# Patient Record
Sex: Male | Born: 1935 | Race: White | Hispanic: No | Marital: Married | Smoking: Never smoker
Health system: Southern US, Community
[De-identification: ages and names within clinical notes are randomized; demographics above are authoritative.]

## PROBLEM LIST (undated history)

## (undated) VITALS — BP 128/72 | HR 60 | Temp 97.8°F | Resp 18 | Ht 70.98 in | Wt 245.7 lb

## (undated) DIAGNOSIS — N400 Enlarged prostate without lower urinary tract symptoms: Secondary | ICD-10-CM

## (undated) DIAGNOSIS — G2581 Restless legs syndrome: Secondary | ICD-10-CM

## (undated) DIAGNOSIS — I2510 Atherosclerotic heart disease of native coronary artery without angina pectoris: Secondary | ICD-10-CM

## (undated) DIAGNOSIS — I10 Essential (primary) hypertension: Secondary | ICD-10-CM

## (undated) DIAGNOSIS — I739 Peripheral vascular disease, unspecified: Secondary | ICD-10-CM

## (undated) DIAGNOSIS — E785 Hyperlipidemia, unspecified: Secondary | ICD-10-CM

## (undated) DIAGNOSIS — I6529 Occlusion and stenosis of unspecified carotid artery: Secondary | ICD-10-CM

## (undated) HISTORY — DX: Essential (primary) hypertension: I10

## (undated) HISTORY — DX: Occlusion and stenosis of unspecified carotid artery: I65.29

## (undated) HISTORY — PX: APPENDECTOMY: SHX54

## (undated) HISTORY — DX: Benign prostatic hyperplasia without lower urinary tract symptoms: N40.0

## (undated) HISTORY — DX: Hyperlipidemia, unspecified: E78.5

## (undated) HISTORY — DX: Restless legs syndrome: G25.81

## (undated) HISTORY — PX: CATARACT EXTRACTION: SUR2

## (undated) HISTORY — DX: Peripheral vascular disease, unspecified: I73.9

## (undated) HISTORY — DX: Atherosclerotic heart disease of native coronary artery without angina pectoris: I25.10

---

## 2003-03-21 HISTORY — PX: OTHER SURGICAL HISTORY: SHX169

## 2004-03-20 HISTORY — PX: OTHER SURGICAL HISTORY: SHX169

## 2004-05-20 ENCOUNTER — Ambulatory Visit: Payer: Self-pay | Admitting: Surgery

## 2004-06-06 ENCOUNTER — Inpatient Hospital Stay: Payer: Self-pay | Admitting: Surgery

## 2007-03-21 HISTORY — PX: CORONARY ARTERY BYPASS GRAFT: SHX141

## 2011-05-09 ENCOUNTER — Emergency Department: Payer: Self-pay | Admitting: *Deleted

## 2011-05-09 LAB — URINALYSIS, COMPLETE
Bacteria: NONE SEEN
Blood: NEGATIVE
Glucose,UR: NEGATIVE mg/dL (ref 0–75)
Leukocyte Esterase: NEGATIVE
Nitrite: NEGATIVE
Ph: 5 (ref 4.5–8.0)
RBC,UR: NONE SEEN /HPF (ref 0–5)
Squamous Epithelial: NONE SEEN
WBC UR: NONE SEEN /HPF (ref 0–5)

## 2011-12-04 ENCOUNTER — Inpatient Hospital Stay: Payer: Self-pay | Admitting: Surgery

## 2011-12-04 LAB — COMPREHENSIVE METABOLIC PANEL
Albumin: 3.8 g/dL (ref 3.4–5.0)
Anion Gap: 8 (ref 7–16)
BUN: 35 mg/dL — ABNORMAL HIGH (ref 7–18)
Calcium, Total: 9.2 mg/dL (ref 8.5–10.1)
Chloride: 101 mmol/L (ref 98–107)
Creatinine: 1.83 mg/dL — ABNORMAL HIGH (ref 0.60–1.30)
EGFR (African American): 41 — ABNORMAL LOW
Glucose: 128 mg/dL — ABNORMAL HIGH (ref 65–99)
Potassium: 4.7 mmol/L (ref 3.5–5.1)
Sodium: 135 mmol/L — ABNORMAL LOW (ref 136–145)
Total Protein: 7.8 g/dL (ref 6.4–8.2)

## 2011-12-04 LAB — CBC
HGB: 15.6 g/dL (ref 13.0–18.0)
MCH: 31.5 pg (ref 26.0–34.0)
MCV: 89 fL (ref 80–100)
RBC: 4.96 10*6/uL (ref 4.40–5.90)
RDW: 13.7 % (ref 11.5–14.5)

## 2011-12-04 LAB — TROPONIN I: Troponin-I: <0.02 ng/mL

## 2011-12-04 LAB — LIPASE, BLOOD: Lipase: 96 U/L (ref 73–393)

## 2011-12-05 LAB — URINALYSIS, COMPLETE
Bacteria: NONE SEEN
Blood: NEGATIVE
Glucose,UR: NEGATIVE mg/dL (ref 0–75)
Granular Cast: 5
Hyaline Cast: 8
Nitrite: NEGATIVE
Ph: 5 (ref 4.5–8.0)
Protein: NEGATIVE
Specific Gravity: 1.016 (ref 1.003–1.030)
Squamous Epithelial: 1

## 2011-12-05 LAB — CBC WITH DIFFERENTIAL/PLATELET
Eosinophil #: 0.2 10*3/uL (ref 0.0–0.7)
Eosinophil %: 3.4 %
HCT: 39.2 % — ABNORMAL LOW (ref 40.0–52.0)
MCH: 30.6 pg (ref 26.0–34.0)
MCV: 89 fL (ref 80–100)
Monocyte #: 0.8 x10 3/mm (ref 0.2–1.0)
Monocyte %: 18.3 %
Neutrophil %: 59.3 %
Platelet: 116 10*3/uL — ABNORMAL LOW (ref 150–440)
RBC: 4.41 10*6/uL (ref 4.40–5.90)
WBC: 4.4 10*3/uL (ref 3.8–10.6)

## 2011-12-05 LAB — BASIC METABOLIC PANEL
BUN: 33 mg/dL — ABNORMAL HIGH (ref 7–18)
Calcium, Total: 8.1 mg/dL — ABNORMAL LOW (ref 8.5–10.1)
Co2: 25 mmol/L (ref 21–32)
EGFR (African American): 51 — ABNORMAL LOW
EGFR (Non-African Amer.): 44 — ABNORMAL LOW
Glucose: 136 mg/dL — ABNORMAL HIGH (ref 65–99)
Potassium: 4.1 mmol/L (ref 3.5–5.1)
Sodium: 140 mmol/L (ref 136–145)

## 2011-12-06 LAB — CBC WITH DIFFERENTIAL/PLATELET
Basophil #: 0 10*3/uL (ref 0.0–0.1)
Basophil %: 0.2 %
Eosinophil %: 3.5 %
HCT: 38.2 % — ABNORMAL LOW (ref 40.0–52.0)
Lymphocyte #: 1.2 10*3/uL (ref 1.0–3.6)
Lymphocyte %: 21.5 %
MCH: 29.6 pg (ref 26.0–34.0)
MCV: 88 fL (ref 80–100)
Monocyte #: 0.9 x10 3/mm (ref 0.2–1.0)
Neutrophil #: 3.2 10*3/uL (ref 1.4–6.5)
Platelet: 123 10*3/uL — ABNORMAL LOW (ref 150–440)
RBC: 4.34 10*6/uL — ABNORMAL LOW (ref 4.40–5.90)
RDW: 13.3 % (ref 11.5–14.5)
WBC: 5.5 10*3/uL (ref 3.8–10.6)

## 2011-12-06 LAB — BASIC METABOLIC PANEL
Anion Gap: 8 (ref 7–16)
BUN: 16 mg/dL (ref 7–18)
Calcium, Total: 8.1 mg/dL — ABNORMAL LOW (ref 8.5–10.1)
Chloride: 107 mmol/L (ref 98–107)
Co2: 26 mmol/L (ref 21–32)
EGFR (African American): >60
EGFR (Non-African Amer.): >60
Osmolality: 284 (ref 275–301)
Potassium: 4 mmol/L (ref 3.5–5.1)

## 2011-12-08 LAB — STOOL CULTURE

## 2012-04-22 ENCOUNTER — Ambulatory Visit: Payer: Self-pay | Admitting: Ophthalmology

## 2012-04-29 ENCOUNTER — Ambulatory Visit: Payer: Self-pay | Admitting: Ophthalmology

## 2012-06-10 ENCOUNTER — Ambulatory Visit: Payer: Self-pay | Admitting: Ophthalmology

## 2012-11-14 ENCOUNTER — Emergency Department: Payer: Self-pay | Admitting: Emergency Medicine

## 2012-11-14 LAB — URINALYSIS, COMPLETE
Bacteria: NONE SEEN
Bilirubin,UR: NEGATIVE
Blood: NEGATIVE
Glucose,UR: NEGATIVE mg/dL (ref 0–75)
Ketone: NEGATIVE
RBC,UR: NONE SEEN /HPF (ref 0–5)
Specific Gravity: 1.013 (ref 1.003–1.030)
Squamous Epithelial: NONE SEEN

## 2012-11-14 LAB — COMPREHENSIVE METABOLIC PANEL
Albumin: 3.6 g/dL (ref 3.4–5.0)
Alkaline Phosphatase: 80 U/L (ref 50–136)
Anion Gap: 4 — ABNORMAL LOW (ref 7–16)
BUN: 22 mg/dL — ABNORMAL HIGH (ref 7–18)
Bilirubin,Total: 0.5 mg/dL (ref 0.2–1.0)
Calcium, Total: 8.9 mg/dL (ref 8.5–10.1)
Creatinine: 1.23 mg/dL (ref 0.60–1.30)
EGFR (African American): >60
EGFR (Non-African Amer.): 57 — ABNORMAL LOW
Glucose: 118 mg/dL — ABNORMAL HIGH (ref 65–99)
SGOT(AST): 23 U/L (ref 15–37)
SGPT (ALT): 28 U/L (ref 12–78)
Sodium: 138 mmol/L (ref 136–145)
Total Protein: 6.8 g/dL (ref 6.4–8.2)

## 2012-11-14 LAB — CK TOTAL AND CKMB (NOT AT ARMC): CK-MB: 2.1 ng/mL (ref 0.5–3.6)

## 2012-11-14 LAB — CBC
MCHC: 34.8 g/dL (ref 32.0–36.0)
MCV: 88 fL (ref 80–100)
RBC: 4.46 10*6/uL (ref 4.40–5.90)
RDW: 13.6 % (ref 11.5–14.5)
WBC: 7 10*3/uL (ref 3.8–10.6)

## 2014-07-07 NOTE — Discharge Summary (Signed)
PATIENT NAME:  Joel Mata, [name] MR#:  [id number] DATE OF BIRTH:  03/22/1934  DATE OF ADMISSION:  12/04/2011 DATE OF DISCHARGE:  12/07/2011  FINAL DIAGNOSES:  1. Campylobacter enteritis.  2. Ileus, resolved secondary to above diagnosis.  PRINCIPLE PROCEDURES:  1. CT scan of the abdomen and pelvis.  2. Stool cultures.  3. Multiple x-rays.   HOSPITAL COURSE: The patient was admitted from the Emergency Room on the 16th with nausea, vomiting, and abdominal pain. CT scan concerning for partial small bowel obstruction. Nasogastric tube was placed. The patient had diarrhea. Stool cultures were obtained. C. difficile was negative on the day of his discharge. The patient, however had demonstrated Campylobacter in his cultures. He was started on oral Levaquin for five days as an outpatient. Repeat films demonstrated the patient did have resolution of his ileus. Nasogastric tube was removed. His diet was able to be advanced. CDC report was filled out and faxed into the local health department.   DISCHARGE MEDICATIONS: Home medications Including Levaquin 750 mg by mouth daily for five days.   DISCHARGE INSTRUCTIONS:  Call with any questions or concerns. Continued follow-up with Dr. Marcello FennelHande.   ____________________________ Redge GainerMark A. Egbert GaribaldiBird, MD mab:tbf D: 12/10/2011 16:52:28 ET T: 12/11/2011 15:11:53 ET JOB#: [id number]  cc: Loraine LericheMark A. Egbert GaribaldiBird, MD, <Dictator> Barbette ReichmannVishwanath Hande, MD Ariyan Sinnett Kela MillinA Duaine Radin MD ELECTRONICALLY SIGNED 12/13/2011 9:26

## 2014-07-07 NOTE — H&P (Signed)
PATIENT NAME:  Joel Mata, Joel Mata MR#:  [id number] DATE OF BIRTH:  [birth year]  DATE OF ADMISSION:  12/04/2011  CHIEF COMPLAINT: Nausea, vomiting and diarrhea.   HISTORY OF PRESENT ILLNESS: This is a patient who over the last several weeks has been having problems with constipation. He has been taking laxatives and then last Thursday he started having profound diarrhea. He counted 6 to 10 bowel movements every eight hours or so that started on Thursday and then went through Friday and Saturday and on Saturday the diarrhea slowed down, but was still present, but he started having nausea and vomiting. He has vomited multiple times and has not been able to keep down any food or liquids. He comes to the Emergency Room today and I was consulted by the Emergency Room physician for consideration of small bowel obstruction. The patient states that he did have a diarrheal bowel movement smaller than in the past this morning with minimal if any flatus, but he continues to vomit.  He has never had an episode like this before, denies fevers or chills, and denies antibiotic exposure.  No one else in the family is ill with diarrhea as well.   PAST MEDICAL HISTORY: He has coronary artery disease. He had coronary artery bypass graft approximately four years ago. 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 He also has hypertension, but never had a myocardial infarction.   PAST SURGICAL HISTORY:  1. Coronary artery bypass graft. 2. Ruptured appendix 40 years ago.   ALLERGIES: No known drug allergies.   MEDICATIONS: Lisinopril, metoprolol, and aspirin.   FAMILY HISTORY: Noncontributory.   SOCIAL HISTORY: The patient was working for [employer] for many years and now is a Visual merchandiserfarmer. He does not smoke or drink.  REVIEW OF SYSTEMS: Ten system review is performed and negative with the exception of that mentioned in the history of present illness.   PHYSICAL EXAMINATION:    GENERAL: Healthy but uncomfortable-appearing Caucasian male patient.   VITAL SIGNS: Temperature 98.4, pulse 96, respirations 24, and blood pressure 92/69. Pain scale is 4.   HEENT: No scleral icterus.   NECK: No palpable neck nodes.   CHEST: Clear to auscultation.   CARDIAC: Regular rate and rhythm.   ABDOMEN: Distended and tympanitic in the upper abdomen. The lower abdomen shows a right lower quadrant scar without hernia.   EXTREMITIES: No edema. Calves are nontender.   NEUROLOGIC: Grossly intact.   INTEGUMENT: No jaundice.  LABS/RADIOLOGIC STUDIES: Chest x-ray and CT scan are personally reviewed and suggestive of a small bowel obstruction or ileus.   White blood cell count is 6.5 and hemoglobin and hematocrit 15.6 and 43.9. Lipase 96. Creatinine is 1.83, BUN 35, potassium 4.7, and sodium 135.   ASSESSMENT AND PLAN: This is a patient with partial small bowel obstruction. The unusual part of his history is that he was having constipation and then had profound watery diarrhea with multiple stools per day. That has slowed down considerably and now he has nausea and vomiting and CT findings suggestive of small bowel obstruction or ileus. It is unclear to me as to how these are related. I will admit the patient to the hospital and start fluid resuscitation and nausea control, a nasogastric tube will be placed as well, and repeat exam and KUB in the morning. The rationale for this approach has been discussed with the patient and his wife. The possibility of surgery in the near future if  he does not resolve has been discussed. Stool studies will be pending as ordered and we will reassess as the patient's condition evolves.  ____________________________ Adah Salvage Excell Seltzer, MD rec:slb D: 12/04/2011 22:44:58 ET (Entered as incorrect work type - 03) T: 12/05/2011 09:56:59 ET JOB#: [id number]  cc: Adah Salvage. Excell Seltzer, MD, <Dictator> Lattie Haw MD ELECTRONICALLY SIGNED 12/05/2011 21:01

## 2014-07-07 NOTE — H&P (Signed)
  Subjective/Chief Complaint vomiting    History of Present Illness diarrhea started Thursday, then vomiting. continued diarrhea until today, min gas, min pain. 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 PSH CABG, ruptured appy    Past Medical Health Coronary Artery Disease, Hypertension   Past Med/Surgical Hx:  Restless leg Syndrome:   Prostate Problems:   HTN:   CABG:   Triple Bypass:   Appendectomy:   ALLERGIES:  No Known Allergies:   Family and Social History:   Family History Non-Contributory    Social History negative tobacco, negative ETOH, [employer], now farms    Place of Living Home   Review of Systems:   Fever/Chills No    Cough No    Abdominal Pain Yes  minimal    Diarrhea Yes    Constipation Yes  prior to diarrhea    Nausea/Vomiting Yes    SOB/DOE No    Chest Pain No    Dysuria No    Tolerating Diet No  Nauseated  Vomiting    Medications/Allergies Reviewed Medications/Allergies reviewed   Physical Exam:   GEN uncomfortable    HEENT pink conjunctivae    NECK supple    RESP normal resp effort  clear BS  no use of accessory muscles    CARD regular rate    ABD denies tenderness  no hernia  soft  distended  tympanitic. rlq scar    EXTR negative edema    SKIN normal to palpation    PSYCH alert, A+O to time, place, person, good insight   Lab Results: Hepatic:  16-Sep-13 18:39    Bilirubin, Total 1.0   Alkaline Phosphatase 88   SGPT (ALT) 23   SGOT (AST) 20   Total Protein, Serum 7.8   Albumin, Serum 3.8  Routine Chem:  16-Sep-13 18:39    Glucose, Serum  128   BUN  35   Creatinine (comp)  1.83   Sodium, Serum  135   Potassium, Serum 4.7   Chloride, Serum 101   CO2, Serum 26   Calcium (Total), Serum 9.2   Osmolality (calc) 280   eGFR (African American)  41   eGFR (Non-African American)  36 (eGFR values <60mL/min/1.73 m2 may be an indication of chronic kidney disease  (CKD). Calculated eGFR is useful in patients with stable renal function. The eGFR calculation will not be reliable in acutely ill patients when serum creatinine is changing rapidly. It is not useful in  patients on dialysis. The eGFR calculation may not be applicable to patients at the low and high extremes of body sizes, pregnant women, and vegetarians.)   Anion Gap 8   Lipase 96 (Result(s) reported on 04 Dec 2011 at 07:12PM.)  Cardiac:  16-Sep-13 18:39    Troponin I < 0.02 (0.00-0.05 0.05 ng/mL or less: NEGATIVE  Repeat testing in 3-6 hrs  if clinically indicated. >0.05 ng/mL: POTENTIAL  MYOCARDIAL INJURY. Repeat  testing in 3-6 hrs if  clinically indicated. NOTE: An increase or decrease  of 30% or more on serial  testing suggests a  clinically important change)  Routine Hem:  16-Sep-13 18:39    WBC (CBC) 6.5   RBC (CBC) 4.96   Hemoglobin (CBC) 15.6   Hematocrit (CBC) 43.9   Platelet Count (CBC) 153 (Result(s) reported on 04 Dec 2011 at 06:52PM.)   MCV 89   MCH 31.5   MCHC 35.5     RDW 13.7   Radiology Results: XRay:    16-Sep-13 19:11, Chest PA and Lateral   Chest PA and Lateral   REASON FOR EXAM:    chest pain, hypotension  COMMENTS:       PROCEDURE: DXR - DXR CHEST PA (OR AP) AND LATERAL  - Dec 04 2011  7:11PM     RESULT: Two-view chest dated 12/04/2011.    Findings: There is elevation of the right hemidiaphragm. An area of   increased density projects than the base of the lingula. No further focal   recent consolidation of focal infiltrates are appreciated. The patient   status post median sternotomy and coronary artery bypass grafting. The   cardiac silhouette is withinthe upper limits of normal.    IMPRESSION:  Atelectasis versus infiltrate in the region of the lingula.      Verified By: Mikki Santee, M.D., MD  CT:    16-Sep-13 20:55, CT Abdomen and Pelvis Without Contrast   CT Abdomen and Pelvis Without Contrast   REASON FOR EXAM:    (1) diffuse  abdominal pain, distension, vomiting,   unable to tolerate PO contrast  COMMENTS:       PROCEDURE: CT  - CT ABDOMEN AND PELVIS W0  - Dec 04 2011  8:55PM     RESULT: CT abdomen and pelvis dated 12/04/2011.    Technique: Helical noncontrasted 3 mm sections were obtained from the   lung bases through the pubic symphysis.    Findings: The lung bases demonstrate linear areas of increased density   within the right lower lobe region like representing discoid atelectasis.    Noncontrast evaluation of the liver, spleen, adrenals is unremarkable. A     calcified gallstone projects in the region of the neck of the gallbladder   measuring 1.4 cm in diameter. The gallbladder is otherwise unremarkable.     Low attenuating mass is appreciated involving the right and left kidneys   likely representing cysts. Largest on the right measures 6.57 cm in   diameter and on the left 7.67 cm. Aircraft multiple dilated loops of   small bowel are appreciated containing fluid and air.The appears to be   decompressed loops in the region of the distal ileum. The colon is   primarily decompressed. There finding suggesting a transition point in   the right lower quadrant.    There is no evidence of abdominal or pelvic free fluid, loculated fluid   collections, masses, nor adenopathy within the limitations of a   noncontrast CT. There is no evidence of abdominal aortic aneurysm.    IMPRESSION:  Small bowel obstruction versus an ileus as described above.   The transition point appears to be within the right lower quadrant.   Surgical clips identified within the right lower quadrant and etiologies   such as an adhesion is a diagnostic consideration.  2. Findings likely reflecting bilateral renal cysts.   3. Calcified gallstone within the gallbladder.          Verified By: Mikki Santee, M.D., MD     Assessment/Admission Diagnosis sbo but started with constipation then profound diarrhea before  vomiting will admit hydrate stool studies repeat KUB in am   Electronic Signatures: Florene Glen (MD)  (Signed 16-Sep-13 22:40)  Authored: CHIEF COMPLAINT and HISTORY, PAST MEDICAL/SURGIAL HISTORY, ALLERGIES, FAMILY AND SOCIAL HISTORY, REVIEW OF SYSTEMS, PHYSICAL EXAM, LABS, Radiology, ASSESSMENT AND PLAN   Last Updated: 16-Sep-13 22:40 by Florene Glen (MD)

## 2014-07-10 NOTE — Op Note (Signed)
PATIENT NAME:  [name], Joel Mata MR#:  [id number] DATE OF BIRTH:  [birth year]  DATE OF PROCEDURE:  06/10/2012  PREOPERATIVE DIAGNOSIS: Cataract, left eye.   POSTOPERATIVE DIAGNOSIS: Cataract, left eye.   PROCEDURE PERFORMED: Extracapsular cataract extraction using phacoemulsification,  placement of Alcon SN6CWS, 19.5-diopter posterior chamber lens, serial #[id number].   ANESTHESIA: 4% lidocaine and 0.75% Marcaine, a 50:50 mixture with 10 units/mL of Vitrase/Hylenex  given as a peribulbar.   ANESTHESIOLOGIST: Dr. Dimple Caseyice  COMPLICATIONS: None.   ESTIMATED BLOOD LOSS: Less than 1 mL.   SURGEON:  Maylon PeppersSteven A. Ami Mally, MD  ASSISTANT:  None.  ESTIMATED BLOOD LOSS:  Less than 1 ml.  DESCRIPTION OF PROCEDURE:  The patient was brought to the operating room and given a peribulbar block.  The patient was then prepped and draped in the usual fashion.  The vertical rectus muscles were imbricated using 5-0 silk sutures.  These sutures were then clamped to the sterile drapes as bridle sutures.  A limbal peritomy was performed extending 2 clock-hours and hemostasis was obtained with cautery.  A partial-thickness scleral groove was made at the surgical limbus and dissected anteriorly in a lamellar dissection using an Alcon crescent knife.  The anterior chamber was entered supero-temporally with a Superblade and through the lamellar dissection with a 2.6 mm keratome.  DisCoVisc was used to replace the aqueous and a continuous tear capsulorrhexis was carried out.  Hydrodissection and hydrodelineation were carried out with balanced salt and a 27-gauge cannula.  The nucleus was rotated to confirm the effectiveness of the hydrodissection.  Phacoemulsification was carried out using a divide-and-conquer technique.  Total ultrasound time was 1 minute and 25 seconds, with an average power of 25.4%. CDE of 3706. No suture was placed. An AcrySert delivery system was used instead of a ParamedicMonarch shooter.   Miostat was  injected into the anterior chamber through the paracentesis track to inflate the anterior chamber and induce miosis.  At the end of the case, 0.1 mL of cefuroxime was placed into the eye.  The wound was checked for leaks and none were found. The conjunctiva was closed with cautery and the bridle sutures were removed.  Two drops of 0.3% Vigamox were placed on the eye.   An eye shield was placed on the eye.  The patient was discharged to the recovery room in good condition.   ____________________________ Maylon PeppersSteven A. Irma Delancey, MD sad:dm D: 06/10/2012 14:08:00 ET T: 06/10/2012 14:16:59 ET JOB#: [id number]  cc: Viviann SpareSteven A. Flecia Shutter, MD, <Dictator> Erline LevineSTEVEN A Taylorann Tkach MD ELECTRONICALLY SIGNED 06/17/2012 13:38

## 2014-07-10 NOTE — Op Note (Signed)
PATIENT NAME:  Joel Mata, Joel Mata MR#:  [id number] DATE OF BIRTH:  February 25, 1935  DATE OF PROCEDURE:  04/29/2012  PREOPERATIVE DIAGNOSIS:  Cataract, right eye.   POSTOPERATIVE DIAGNOSIS:  Cataract, right eye.  PROCEDURE PERFORMED:  Extracapsular cataract extraction using phacoemulsification with placement of an Alcon SN6CWS, 19.0-diopter posterior chamber lens, serial #[id number].  SURGEON:  Maylon PeppersSteven A. Scotti Motter, MD  ASSISTANT:  None.  ANESTHESIA:  4% lidocaine and 0.75% Marcaine in a 50/50 mixture with 10 units/mL of Hylenex added, given as a peribulbar.   ANESTHESIOLOGIST:  Dr. Dimple Caseyice  COMPLICATIONS:  None.  ESTIMATED BLOOD LOSS:  Less than 1 ml.  DESCRIPTION OF PROCEDURE:  The patient was brought to the operating room and given a peribulbar block.  The patient was then prepped and draped in the usual fashion.  The vertical rectus muscles were imbricated using 5-0 silk sutures.  These sutures were then clamped to the sterile drapes as bridle sutures.  A limbal peritomy was performed extending two clock hours and hemostasis was obtained with cautery.  A partial thickness scleral groove was made at the surgical limbus and dissected anteriorly in a lamellar dissection using an Alcon crescent knife.  The anterior chamber was entered superonasally with a Superblade and through the lamellar dissection with a 2.6 mm keratome.  DisCoVisc was used to replace the aqueous and a continuous tear capsulorrhexis was carried out.  Hydrodissection and hydrodelineation were carried out with balanced salt and a 27 gauge canula.  The nucleus was rotated to confirm the effectiveness of the hydrodissection.  Phacoemulsification was carried out using a divide-and-conquer technique.  Total ultrasound time was 1 minute and 31 seconds with an average power of 24.8 percent and CDE of 37.27.  Irrigation/aspiration was used to remove the residual cortex.  DisCoVisc was used to inflate the capsule and the internal incision  was enlarged to 3 mm with the crescent knife.  The intraocular lens was folded and inserted into the capsular bag using the AcrySert delivery system.  Irrigation/aspiration was used to remove the residual DisCoVisc.  Miostat was injected into the anterior chamber through the paracentesis track to inflate the anterior chamber and induce miosis. A tenth of a milliliter of cefuroxime was placed into the anterior chamber via the paracentesis tract. The wound was checked for leaks and none were found. The conjunctiva was closed with cautery and the bridle sutures were removed.  Two drops of 0.3% Vigamox were placed on the eye.   An eye shield was placed on the eye.  The patient was discharged to the recovery room in good condition. ____________________________ Maylon PeppersSteven A. Eliya Bubar, MD sad:sb D: 04/29/2012 13:11:33 ET T: 04/29/2012 13:31:59 ET JOB#: [id number]  cc: Viviann SpareSteven A. Severa Jeremiah, MD, <Dictator> Erline LevineSTEVEN A Kemyah Buser MD ELECTRONICALLY SIGNED 05/20/2012 13:34

## 2014-07-17 ENCOUNTER — Inpatient Hospital Stay
Admission: EM | Admit: 2014-07-17 | Discharge: 2014-07-20 | DRG: 282 | Disposition: A | Discharge status: Home / Self Care | Payer: Medicare Other | Attending: Internal Medicine | Admitting: Internal Medicine

## 2014-07-17 DIAGNOSIS — I10 Essential (primary) hypertension: Secondary | ICD-10-CM | POA: Diagnosis present

## 2014-07-17 DIAGNOSIS — Z79899 Other long term (current) drug therapy: Secondary | ICD-10-CM | POA: Diagnosis not present

## 2014-07-17 DIAGNOSIS — E785 Hyperlipidemia, unspecified: Secondary | ICD-10-CM | POA: Diagnosis present

## 2014-07-17 DIAGNOSIS — Z955 Presence of coronary angioplasty implant and graft: Secondary | ICD-10-CM | POA: Diagnosis not present

## 2014-07-17 DIAGNOSIS — G2581 Restless legs syndrome: Secondary | ICD-10-CM | POA: Diagnosis present

## 2014-07-17 DIAGNOSIS — Z8249 Family history of ischemic heart disease and other diseases of the circulatory system: Secondary | ICD-10-CM | POA: Diagnosis not present

## 2014-07-17 DIAGNOSIS — N400 Enlarged prostate without lower urinary tract symptoms: Secondary | ICD-10-CM | POA: Diagnosis present

## 2014-07-17 DIAGNOSIS — Z801 Family history of malignant neoplasm of trachea, bronchus and lung: Secondary | ICD-10-CM | POA: Diagnosis not present

## 2014-07-17 DIAGNOSIS — I25110 Atherosclerotic heart disease of native coronary artery with unstable angina pectoris: Secondary | ICD-10-CM | POA: Diagnosis present

## 2014-07-17 DIAGNOSIS — Z951 Presence of aortocoronary bypass graft: Secondary | ICD-10-CM

## 2014-07-17 DIAGNOSIS — I200 Unstable angina: Secondary | ICD-10-CM | POA: Diagnosis present

## 2014-07-17 DIAGNOSIS — Z7982 Long term (current) use of aspirin: Secondary | ICD-10-CM

## 2014-07-17 DIAGNOSIS — I214 Non-ST elevation (NSTEMI) myocardial infarction: Principal | ICD-10-CM | POA: Diagnosis present

## 2014-07-17 LAB — CBC
HCT: 38.6 % — AB (ref 40.0–52.0)
HGB: 13.3 g/dL (ref 13.0–18.0)
MCH: 30.2 pg (ref 26.0–34.0)
MCHC: 34.4 g/dL (ref 32.0–36.0)
MCV: 88 fL (ref 80–100)
Platelet: 146 10*3/uL — ABNORMAL LOW (ref 150–440)
RBC: 4.41 10*6/uL (ref 4.40–5.90)
RDW: 13.1 % (ref 11.5–14.5)
WBC: 7 10*3/uL (ref 3.8–10.6)

## 2014-07-17 LAB — COMPREHENSIVE METABOLIC PANEL
Albumin: 3.9 g/dL
Alkaline Phosphatase: 55 U/L
Anion Gap: 8 (ref 7–16)
BUN: 23 mg/dL — ABNORMAL HIGH
Bilirubin,Total: 0.6 mg/dL
Calcium, Total: 9.1 mg/dL
Chloride: 102 mmol/L
Co2: 26 mmol/L
Creatinine: 1.27 mg/dL — ABNORMAL HIGH
EGFR (African American): >60
EGFR (Non-African Amer.): 54 — ABNORMAL LOW
Glucose: 121 mg/dL — ABNORMAL HIGH
Potassium: 4.2 mmol/L
SGOT(AST): 23 U/L
SGPT (ALT): 24 U/L
Sodium: 136 mmol/L
Total Protein: 6.6 g/dL

## 2014-07-17 LAB — URINALYSIS, COMPLETE
Bacteria: NONE SEEN
Bilirubin,UR: NEGATIVE
Blood: NEGATIVE
Glucose,UR: NEGATIVE mg/dL (ref 0–75)
Ketone: NEGATIVE
Leukocyte Esterase: NEGATIVE
NITRITE: NEGATIVE
Ph: 5 (ref 4.5–8.0)
Protein: NEGATIVE
RBC, UR: NONE SEEN /HPF (ref 0–5)
Specific Gravity: 1.016 (ref 1.003–1.030)
Squamous Epithelial: NONE SEEN

## 2014-07-17 LAB — PROTIME-INR
INR: 1
Prothrombin Time: 13.3 secs

## 2014-07-17 LAB — APTT: Activated PTT: 31.8 secs (ref 23.6–35.9)

## 2014-07-17 LAB — TROPONIN I: Troponin-I: <0.03 ng/mL

## 2014-07-18 DIAGNOSIS — I200 Unstable angina: Secondary | ICD-10-CM | POA: Diagnosis present

## 2014-07-18 LAB — LIPID PANEL
Cholesterol: 225 mg/dL — ABNORMAL HIGH
HDL Cholesterol: 41 mg/dL
LDL CHOLESTEROL, CALC: 151 mg/dL — AB
Triglycerides: 166 mg/dL — ABNORMAL HIGH
VLDL Cholesterol, Calc: 33 mg/dL

## 2014-07-18 LAB — COMPREHENSIVE METABOLIC PANEL
ALBUMIN: 3.8 g/dL
ALK PHOS: 52 U/L
ANION GAP: 6 — AB (ref 7–16)
BUN: 20 mg/dL
Bilirubin,Total: 0.8 mg/dL
CO2: 26 mmol/L
Calcium, Total: 9.2 mg/dL
Chloride: 105 mmol/L
Creatinine: 1.08 mg/dL
EGFR (African American): >60
EGFR (Non-African Amer.): >60
Glucose: 120 mg/dL — ABNORMAL HIGH
POTASSIUM: 4.6 mmol/L
SGOT(AST): 60 U/L — ABNORMAL HIGH
SGPT (ALT): 27 U/L
Sodium: 137 mmol/L
Total Protein: 6.6 g/dL

## 2014-07-18 LAB — HEPARIN LEVEL (UNFRACTIONATED)
ANTI-XA(UNFRACTIONATED): 0.39 [IU]/mL (ref 0.30–0.70)
ANTI-XA(UNFRACTIONATED): 0.26 [IU]/mL — AB (ref 0.30–0.70)
Anti-Xa(Unfractionated): 0.4 IU/mL (ref 0.30–0.70)

## 2014-07-18 LAB — CBC WITH DIFFERENTIAL/PLATELET
Basophil #: 0 10*3/uL (ref 0.0–0.1)
Basophil %: 0.3 %
EOS ABS: 0.1 10*3/uL (ref 0.0–0.7)
Eosinophil %: 1.3 %
HCT: 39 % — ABNORMAL LOW (ref 40.0–52.0)
HGB: 13.1 g/dL (ref 13.0–18.0)
Lymphocyte #: 1.5 10*3/uL (ref 1.0–3.6)
Lymphocyte %: 15.4 %
MCH: 30 pg (ref 26.0–34.0)
MCHC: 33.7 g/dL (ref 32.0–36.0)
MCV: 89 fL (ref 80–100)
MONO ABS: 0.5 x10 3/mm (ref 0.2–1.0)
MONOS PCT: 5.4 %
Neutrophil #: 7.6 10*3/uL — ABNORMAL HIGH (ref 1.4–6.5)
Neutrophil %: 77.6 %
PLATELETS: 148 10*3/uL — AB (ref 150–440)
RBC: 4.38 10*6/uL — AB (ref 4.40–5.90)
RDW: 13.2 % (ref 11.5–14.5)
WBC: 9.8 10*3/uL (ref 3.8–10.6)

## 2014-07-18 LAB — CK-MB
CK-MB: 3.8 ng/mL
CK-MB: 93.5 ng/mL — ABNORMAL HIGH
CK-MB: 123.4 ng/mL — ABNORMAL HIGH

## 2014-07-18 LAB — TROPONIN I
Troponin-I: 1.06 ng/mL — ABNORMAL HIGH
Troponin-I: 4.21 ng/mL — ABNORMAL HIGH

## 2014-07-18 MED ORDER — ROPINIROLE HCL 0.25 MG PO TABS
0.2500 mg | ORAL_TABLET | Freq: Every day | ORAL | Status: DC
  Administered 2014-07-19: 0.25 mg via ORAL
  Filled 2014-07-18 (×2): qty 1

## 2014-07-18 MED ORDER — ASPIRIN EC 325 MG PO TBEC
325.0000 mg | DELAYED_RELEASE_TABLET | Freq: Every day | ORAL | Status: DC
  Administered 2014-07-19: 325 mg via ORAL
  Filled 2014-07-18: qty 1

## 2014-07-18 MED ORDER — DUTASTERIDE 0.5 MG PO CAPS
0.5000 mg | ORAL_CAPSULE | Freq: Every day | ORAL | Status: DC
  Filled 2014-07-18 (×2): qty 1

## 2014-07-18 MED ORDER — ZOLPIDEM TARTRATE 5 MG PO TABS: 5.0000 mg | ORAL_TABLET | Freq: Every evening | ORAL | Status: DC | PRN

## 2014-07-18 MED ORDER — NITROGLYCERIN 2 % TD OINT
2.0000 [in_us] | TOPICAL_OINTMENT | Freq: Two times a day (BID) | TRANSDERMAL | Status: DC
  Administered 2014-07-19 – 2014-07-20 (×4): 2 [in_us] via TOPICAL
  Filled 2014-07-18 (×4): qty 2

## 2014-07-18 MED ORDER — LABETALOL HCL 5 MG/ML IV SOLN: 10.0000 mg | INTRAVENOUS | Status: DC | PRN

## 2014-07-18 MED ORDER — HYDRALAZINE HCL 20 MG/ML IJ SOLN: 10.0000 mg | INTRAMUSCULAR | Status: DC | PRN

## 2014-07-18 MED ORDER — HEPARIN (PORCINE) IN NACL 100-0.45 UNIT/ML-% IJ SOLN
1500.0000 [IU]/h | INTRAMUSCULAR | Status: DC
  Administered 2014-07-19: 1500 [IU]/h via INTRAVENOUS
  Filled 2014-07-18 (×4): qty 250

## 2014-07-18 MED ORDER — SODIUM CHLORIDE 0.9 % IV SOLN
INTRAVENOUS | Status: DC
  Administered 2014-07-19: 02:00:00 via INTRAVENOUS

## 2014-07-18 MED ORDER — DOCUSATE SODIUM 100 MG PO CAPS: 100.0000 mg | ORAL_CAPSULE | Freq: Two times a day (BID) | ORAL | Status: DC | PRN

## 2014-07-18 MED ORDER — LISINOPRIL 20 MG PO TABS
20.0000 mg | ORAL_TABLET | Freq: Every day | ORAL | Status: DC
  Administered 2014-07-19 – 2014-07-20 (×2): 20 mg via ORAL
  Filled 2014-07-18 (×2): qty 1

## 2014-07-18 MED ORDER — SODIUM CHLORIDE 0.9 % IJ SOLN: 3.0000 mL | INTRAMUSCULAR | Status: DC | PRN

## 2014-07-18 MED ORDER — ACETAMINOPHEN 325 MG PO TABS
650.0000 mg | ORAL_TABLET | ORAL | Status: DC | PRN
  Administered 2014-07-19 (×2): 650 mg via ORAL
  Filled 2014-07-18 (×2): qty 2

## 2014-07-18 MED ORDER — ATORVASTATIN CALCIUM 20 MG PO TABS: 80.0000 mg | ORAL_TABLET | Freq: Every day | ORAL | Status: DC

## 2014-07-18 MED ORDER — METOPROLOL TARTRATE 25 MG PO TABS
25.0000 mg | ORAL_TABLET | Freq: Two times a day (BID) | ORAL | Status: DC
  Administered 2014-07-19 – 2014-07-20 (×3): 25 mg via ORAL
  Filled 2014-07-18 (×3): qty 1

## 2014-07-18 MED ORDER — PANTOPRAZOLE SODIUM 40 MG PO TBEC
40.0000 mg | DELAYED_RELEASE_TABLET | Freq: Every day | ORAL | Status: DC
  Administered 2014-07-19 – 2014-07-20 (×2): 40 mg via ORAL
  Filled 2014-07-18 (×2): qty 1

## 2014-07-18 MED ORDER — ONDANSETRON HCL 4 MG/2ML IJ SOLN: 4.0000 mg | INTRAMUSCULAR | Status: DC | PRN

## 2014-07-18 MED ORDER — SENNA 8.6 MG PO TABS: 1.0000 | ORAL_TABLET | Freq: Two times a day (BID) | ORAL | Status: DC | PRN

## 2014-07-19 LAB — CBC WITH DIFFERENTIAL/PLATELET
BASOS ABS: 0 10*3/uL (ref 0–0.1)
Eosinophils Absolute: 0.3 10*3/uL (ref 0–0.7)
Eosinophils Relative: 4 %
HEMATOCRIT: 40 % (ref 40.0–52.0)
Hemoglobin: 13.6 g/dL (ref 13.0–18.0)
Lymphs Abs: 1.4 10*3/uL (ref 1.0–3.6)
MCH: 30.3 pg (ref 26.0–34.0)
MCHC: 34.1 g/dL (ref 32.0–36.0)
MCV: 88.8 fL (ref 80.0–100.0)
MONO ABS: 0.5 10*3/uL (ref 0.2–1.0)
Monocytes Relative: 8 %
Neutro Abs: 4.4 10*3/uL (ref 1.4–6.5)
Neutrophils Relative %: 66 %
PLATELETS: 134 10*3/uL — AB (ref 150–440)
RBC: 4.5 MIL/uL (ref 4.40–5.90)
RDW: 13.6 % (ref 11.5–14.5)
WBC: 6.7 10*3/uL (ref 3.8–10.6)

## 2014-07-19 LAB — PROTIME-INR
INR: 1.02
PROTHROMBIN TIME: 13.6 s (ref 11.4–15.0)

## 2014-07-19 LAB — BASIC METABOLIC PANEL
Anion gap: 9 (ref 5–15)
BUN: 19 mg/dL (ref 6–20)
CO2: 23 mmol/L (ref 22–32)
CREATININE: 1.13 mg/dL (ref 0.61–1.24)
Calcium: 8.9 mg/dL (ref 8.9–10.3)
Chloride: 108 mmol/L (ref 101–111)
GFR calc Af Amer: >60 mL/min (ref >60)
GLUCOSE: 106 mg/dL — AB (ref 65–99)
Potassium: 4.2 mmol/L (ref 3.5–5.1)
Sodium: 140 mmol/L (ref 135–145)

## 2014-07-19 LAB — HEPARIN LEVEL (UNFRACTIONATED): HEPARIN UNFRACTIONATED: 0.11 [IU]/mL — AB (ref 0.30–0.70)

## 2014-07-19 LAB — CK TOTAL AND CKMB (NOT AT ARMC)
CK TOTAL: 402 U/L — AB (ref 49–397)
CK, MB: 41.9 ng/mL — ABNORMAL HIGH (ref 0.5–5.0)
RELATIVE INDEX: 10.4 — AB (ref 0.0–2.5)

## 2014-07-19 LAB — TROPONIN I: TROPONIN I: 13.87 ng/mL — AB (ref <0.031)

## 2014-07-19 MED ORDER — ACETAMINOPHEN 325 MG PO TABS
650.0000 mg | ORAL_TABLET | Freq: Every evening | ORAL | Status: DC | PRN
  Administered 2014-07-19: 650 mg via ORAL

## 2014-07-19 MED ORDER — DIPHENHYDRAMINE HCL 50 MG PO CAPS
50.0000 mg | ORAL_CAPSULE | Freq: Every evening | ORAL | Status: DC | PRN
  Administered 2014-07-19: 50 mg via ORAL
  Filled 2014-07-19: qty 1

## 2014-07-19 MED ORDER — HEPARIN BOLUS VIA INFUSION
3000.0000 [IU] | Freq: Once | INTRAVENOUS | Status: AC
  Administered 2014-07-19: 3000 [IU] via INTRAVENOUS
  Filled 2014-07-19: qty 3000

## 2014-07-19 MED ORDER — CYCLOBENZAPRINE HCL 10 MG PO TABS
10.0000 mg | ORAL_TABLET | Freq: Three times a day (TID) | ORAL | Status: DC | PRN
  Administered 2014-07-19: 10 mg via ORAL
  Filled 2014-07-19: qty 1

## 2014-07-19 MED ORDER — HEPARIN (PORCINE) IN NACL 100-0.45 UNIT/ML-% IJ SOLN
1600.0000 [IU]/h | INTRAMUSCULAR | Status: DC
  Administered 2014-07-19: 1800 [IU]/h via INTRAVENOUS
  Filled 2014-07-19 (×5): qty 250

## 2014-07-19 MED ORDER — DIAZEPAM 5 MG/ML IJ SOLN
5.0000 mg | Freq: Once | INTRAMUSCULAR | Status: AC
  Administered 2014-07-19: 5 mg via INTRAVENOUS
  Filled 2014-07-19: qty 2

## 2014-07-19 MED ORDER — LORAZEPAM 1 MG PO TABS
1.0000 mg | ORAL_TABLET | Freq: Four times a day (QID) | ORAL | Status: DC | PRN
  Administered 2014-07-19: 1 mg via ORAL
  Filled 2014-07-19: qty 1

## 2014-07-19 MED ORDER — SODIUM CHLORIDE 0.9 % IV SOLN
INTRAVENOUS | Status: DC
  Administered 2014-07-19 (×2): via INTRAVENOUS

## 2014-07-19 MED ORDER — MORPHINE SULFATE 2 MG/ML IJ SOLN
2.0000 mg | Freq: Once | INTRAMUSCULAR | Status: AC
  Administered 2014-07-19: 2 mg via INTRAVENOUS
  Filled 2014-07-19: qty 1

## 2014-07-19 MED ORDER — MORPHINE SULFATE 2 MG/ML IJ SOLN
2.0000 mg | INTRAMUSCULAR | Status: DC | PRN
Start: 2014-07-19 — End: 2014-07-20
  Administered 2014-07-19: 2 mg via INTRAVENOUS
  Filled 2014-07-19: qty 2

## 2014-07-19 NOTE — Progress Notes (Signed)
A&Ox4.  NSR on tele.  VSS. Pt c/o restless leg syndrome.  Medicated per MAR.  Pt states meds sometimes don't work at home either.  MD notified at 0200 and suggested to give acetaminophen.  No dyspnea at rest.  Pt did not sleep well throughout night.   Cristela FeltHelen Iris Guidry, RN

## 2014-07-19 NOTE — Progress Notes (Signed)
   SUBJECTIVE: I don't have any chest pain   Filed Vitals:   07/18/14 0915 07/19/14 0509 07/19/14 0515 07/19/14 0604  BP:  180/77 191/80   Pulse:  61    Resp:  19    Height: 5' 10.98" (1.803 m)     Weight: 104.3 kg (229 lb 15 oz)   111.449 kg (245 lb 11.2 oz)  SpO2:  98%      Intake/Output Summary (Last 24 hours) at 07/19/14 0911 Last data filed at 07/19/14 0800  Gross per 24 hour  Intake      0 ml  Output   1475 ml  Net  -1475 ml    LABS: Basic Metabolic Panel:  Recent Labs  96/06/5402/29/16 1750 07/18/14 0342  CO2 26 26  BUN 23* 20  CREATININE 1.27* 1.08  CALCIUM 9.1 9.2   Liver Function Tests:  Recent Labs  07/17/14 1750 07/18/14 0342  AST 23 60*  ALT 24 27  ALKPHOS 55 52  PROT 6.6 6.6  ALBUMIN 3.9 3.8   No results for input(s): LIPASE, AMYLASE in the last 72 hours. CBC:  Recent Labs  07/18/14 0342 07/19/14 0447  WBC 9.8 6.7  NEUTROABS 7.6* 4.4  HGB 13.1 13.6  HCT 39.0* 40.0  MCV 89 88.8  PLT 148* 134*   Cardiac Enzymes: No results for input(s): CKTOTAL, CKMB, CKMBINDEX, TROPONINI in the last 72 hours. BNP: Invalid input(s): POCBNP D-Dimer: No results for input(s): DDIMER in the last 72 hours. Hemoglobin A1C: No results for input(s): HGBA1C in the last 72 hours. Fasting Lipid Panel: No results for input(s): CHOL, HDL, LDLCALC, TRIG, CHOLHDL, LDLDIRECT in the last 72 hours. Thyroid Function Tests: No results for input(s): TSH, T4TOTAL, T3FREE, THYROIDAB in the last 72 hours.  Invalid input(s): FREET3 Anemia Panel: No results for input(s): VITAMINB12, FOLATE, FERRITIN, TIBC, IRON, RETICCTPCT in the last 72 hours.   PHYSICAL EXAM General: Well developed, well nourished, in no acute distress HEENT:  Normocephalic and atramatic Neck:  No JVD.  Lungs: Clear bilaterally to auscultation and percussion. Heart: HRRR . Normal S1 and S2 without gallops or murmurs.  Abdomen: Bowel sounds are positive, abdomen soft and non-tender  Msk:  Back normal,  normal gait. Normal strength and tone for age. Extremities: No clubbing, cyanosis or edema.   Neuro: Alert and oriented X 3. Psych:  Good affect, responds appropriately  TELEMETRY: Reviewed telemetry pt in sinus rhythm  ASSESSMENT AND PLAN:  Active Problems:   Unstable angina   1. Non-ST myocardial infarction, patient currently chest pain-free  2. Status post coronary stent 15 years ago, CABG 9 years ago 3. Essential hypertension  Plan #1 continue heparin drip #2 cardiac catheterization with selective coronary arteriography for the a.m., risks, benefits and alternatives were explained to the patient and informed written consent was obtained  Marcina MillardPARASCHOS,Sheila Ocasio, MD, PhD, [facility] CenterFACC 07/19/2014 9:11 AM

## 2014-07-19 NOTE — Progress Notes (Signed)
Spoke with Dr Judithann SheenSparks at [phone] regarding pt back pain at 5/10 with no relief from tylenol earlier.  Acknowledge, orders received.  Read back & verified.  Cristela FeltHelen Iris Guidry, RN

## 2014-07-19 NOTE — Progress Notes (Signed)
Joel Mata is a 80 y.o. male  Pt with right lower back pain and problems with RLS. Denies CP or SOB. Cardiology consult noted. For cath tomorrow. Cannot tolerate his cholesterol med   SUBJECTIVE:  Back pain, s/p acute MI, RLS  ______________________________________________________________________  ROS: Review of systems is unremarkable for any active cardiac,respiratory, GI, GU, hematologic, neurologic or psychiatric systems, 10 systems reviewed.  @CMEDLIST @  No past medical history on file.  No past surgical history on file.  PHYSICAL EXAM:  BP 191/80 mmHg  Pulse 61  Resp 19  Ht 5' 10.98" (1.803 m)  Wt 111.449 kg (245 lb 11.2 oz)  BMI 34.28 kg/m2  SpO2 98%  Wt Readings from Last 3 Encounters:  07/19/14 111.449 kg (245 lb 11.2 oz)            Constitutional: NAD Neck: supple, no thyromegaly Respiratory: CTA, no rales or wheezes Cardiovascular: RRR, no murmur, no gallop Abdomen: soft, good BS, nontender Extremities: no edema Neuro: alert and oriented, no focal motor or sensory deficits  ASSESSMENT/PLAN: Will stop statin for now. Add Flexeril prn. Cardiac cath tomorrow. Await f/u from Cardiology. Repeat labs in AM  Labs and imaging studies were reviewed  Plans discussed with patient and nursing.

## 2014-07-19 NOTE — Progress Notes (Signed)
ANTICOAGULATION CONSULT NOTE - Follow Up Consult  Pharmacy Consult for Heparin Drip Indication: chest pain/ACS  No Known Allergies  Patient Measurements: Height: 5' 10.98" (180.3 cm) Weight: 245 lb 11.2 oz (111.449 kg) IBW/kg (Calculated) : 75.26 Heparin Dosing Weight: 99 kg  Vital Signs: Temp: 97.4 F (36.3 C) (05/01 1016) Temp Source: Oral (05/01 1016) BP: 189/100 mmHg (05/01 1016) Pulse Rate: 76 (05/01 1016)  Labs:  Recent Labs  07/17/14 1750  07/18/14 0342 07/18/14 1148 07/18/14 2145 07/19/14 0447 07/19/14 0914  HGB 13.3  --  13.1  --   --  13.6  --   HCT 38.6*  --  39.0*  --   --  40.0  --   PLT 146*  --  148*  --   --  134*  --   APTT 31.8  --   --   --   --   --   --   LABPROT 13.3  --   --   --   --  13.6  --   INR 1.0  --   --   --   --  1.02  --   HEPARINUNFRC  --   < > 0.39 0.26* 0.40  --  0.11*  CREATININE 1.27*  --  1.08  --   --  1.13  --   CKTOTAL  --   --   --   --   --  402*  --   CKMB  --   --   --   --   --  41.9*  --   TROPONINI  --   --   --   --   --  13.87*  --   < > = values in this interval not displayed.  Estimated Creatinine Clearance: 69.5 mL/min (by C-G formula based on Cr of 1.13).   Medications:  Scheduled:  . aspirin EC  325 mg Oral Daily  . dutasteride  0.5 mg Oral Daily  . lisinopril  20 mg Oral Daily  . metoprolol tartrate  25 mg Oral BID  . nitroGLYCERIN  2 inch Topical BID  . pantoprazole  40 mg Oral Daily  . rOPINIRole  0.25 mg Oral QHS   Infusions:  . sodium chloride Stopped (07/19/14 0205)  . sodium chloride 75 mL/hr at 07/19/14 0205  . heparin 1,500 Units/hr (07/19/14 1021)    Assessment: 80 yo male on Heparin drip for ACS. Height: 5' 10.98" (180.3 cm) Weight: 245 lb 11.2 oz (111.449 kg) IBW/kg (Calculated) : 75.26 Heparin Dosing Weight: 99 kg Goal of Therapy:  Heparin level 0.3-0.7 units/ml Monitor platelets by anticoagulation protocol: Yes   Plan:  Patient currently on Heparin drip at 1500  units/hr. 5/1 AntiXa at 0914= 0.11. Will give 30 units/kg bolus= 3000 units x 1 and increase drip by 3 units/kg/hr to 1800 units/hr.  Check anti-Xa level in 8 hours and daily while on heparin  Bari MantisKristin Demetrus Pavao PharmD Clinical Pharmacist 07/19/2014

## 2014-07-20 ENCOUNTER — Encounter
Admission: EM | Disposition: A | Discharge status: Home / Self Care | Payer: Medicare Other | Source: Home / Self Care | Attending: Internal Medicine

## 2014-07-20 HISTORY — PX: CARDIAC CATHETERIZATION: SHX172

## 2014-07-20 LAB — HEPARIN LEVEL (UNFRACTIONATED)
HEPARIN UNFRACTIONATED: 0.8 [IU]/mL — AB (ref 0.30–0.70)
Heparin Unfractionated: 0.96 IU/mL — ABNORMAL HIGH (ref 0.30–0.70)

## 2014-07-20 LAB — CBC WITH DIFFERENTIAL/PLATELET
BASOS ABS: 0 10*3/uL (ref 0–0.1)
EOS ABS: 0.2 10*3/uL (ref 0–0.7)
Eosinophils Relative: 4 %
HCT: 36.2 % — ABNORMAL LOW (ref 40.0–52.0)
HEMOGLOBIN: 12 g/dL — AB (ref 13.0–18.0)
Lymphocytes Relative: 27 %
Lymphs Abs: 1.6 10*3/uL (ref 1.0–3.6)
MCH: 29.5 pg (ref 26.0–34.0)
MCHC: 33.3 g/dL (ref 32.0–36.0)
MCV: 88.7 fL (ref 80.0–100.0)
Monocytes Absolute: 0.7 10*3/uL (ref 0.2–1.0)
Monocytes Relative: 11 %
Neutro Abs: 3.4 10*3/uL (ref 1.4–6.5)
Neutrophils Relative %: 58 %
Platelets: 132 10*3/uL — ABNORMAL LOW (ref 150–440)
RBC: 4.08 MIL/uL — AB (ref 4.40–5.90)
RDW: 13.4 % (ref 11.5–14.5)
WBC: 5.9 10*3/uL (ref 3.8–10.6)

## 2014-07-20 LAB — BASIC METABOLIC PANEL
ANION GAP: 5 (ref 5–15)
BUN: 21 mg/dL — ABNORMAL HIGH (ref 6–20)
CALCIUM: 8.1 mg/dL — AB (ref 8.9–10.3)
CO2: 26 mmol/L (ref 22–32)
Chloride: 107 mmol/L (ref 101–111)
Creatinine, Ser: 1.34 mg/dL — ABNORMAL HIGH (ref 0.61–1.24)
GFR calc non Af Amer: 49 mL/min — ABNORMAL LOW (ref >60)
GFR, EST AFRICAN AMERICAN: 57 mL/min — AB (ref >60)
Glucose, Bld: 99 mg/dL (ref 65–99)
Potassium: 4 mmol/L (ref 3.5–5.1)
Sodium: 138 mmol/L (ref 135–145)

## 2014-07-20 LAB — TROPONIN I: Troponin I: 8.74 ng/mL — ABNORMAL HIGH (ref <0.031)

## 2014-07-20 SURGERY — LEFT HEART CATH AND CORS/GRAFTS ANGIOGRAPHY
Anesthesia: Moderate Sedation

## 2014-07-20 MED ORDER — SODIUM CHLORIDE 0.9 % IJ SOLN: 3.0000 mL | Freq: Two times a day (BID) | INTRAMUSCULAR | Status: DC

## 2014-07-20 MED ORDER — MIDAZOLAM HCL 2 MG/2ML IJ SOLN
INTRAMUSCULAR | Status: DC | PRN
  Administered 2014-07-20: 1 mg via INTRAVENOUS

## 2014-07-20 MED ORDER — ACETAMINOPHEN 325 MG PO TABS: 650.0000 mg | ORAL_TABLET | ORAL | Status: DC | PRN

## 2014-07-20 MED ORDER — ISOSORBIDE MONONITRATE ER 30 MG PO TB24: 30.0000 mg | ORAL_TABLET | Freq: Every day | ORAL | Status: DC

## 2014-07-20 MED ORDER — SODIUM CHLORIDE 0.9 % IJ SOLN: 3.0000 mL | INTRAMUSCULAR | Status: DC | PRN

## 2014-07-20 MED ORDER — ASPIRIN 325 MG PO TBEC: 325.0000 mg | DELAYED_RELEASE_TABLET | Freq: Every day | ORAL | Status: DC

## 2014-07-20 MED ORDER — INSULIN ASPART 100 UNIT/ML ~~LOC~~ SOLN
SUBCUTANEOUS | Status: AC
  Filled 2014-07-20: qty 1

## 2014-07-20 MED ORDER — CLOPIDOGREL BISULFATE 75 MG PO TABS
75.0000 mg | ORAL_TABLET | Freq: Every day | ORAL | Status: DC
  Administered 2014-07-20: 75 mg via ORAL
  Filled 2014-07-20: qty 1

## 2014-07-20 MED ORDER — HEPARIN (PORCINE) IN NACL 2-0.9 UNIT/ML-% IJ SOLN
INTRAMUSCULAR | Status: AC
  Filled 2014-07-20: qty 500

## 2014-07-20 MED ORDER — IOHEXOL 350 MG/ML SOLN
INTRAVENOUS | Status: DC | PRN
  Administered 2014-07-20: 150 mL via INTRA_ARTERIAL
  Administered 2014-07-20: 75 mL via INTRA_ARTERIAL

## 2014-07-20 MED ORDER — ONDANSETRON HCL 4 MG/2ML IJ SOLN: 4.0000 mg | Freq: Four times a day (QID) | INTRAMUSCULAR | Status: DC | PRN

## 2014-07-20 MED ORDER — FENTANYL CITRATE (PF) 100 MCG/2ML IJ SOLN
INTRAMUSCULAR | Status: DC | PRN
  Administered 2014-07-20: 25 ug via INTRAVENOUS

## 2014-07-20 MED ORDER — METOPROLOL TARTRATE 25 MG PO TABS: 25.0000 mg | ORAL_TABLET | Freq: Two times a day (BID) | ORAL | Status: AC

## 2014-07-20 MED ORDER — SODIUM CHLORIDE 0.9 % IV SOLN: 250.0000 mL | INTRAVENOUS | Status: DC | PRN

## 2014-07-20 MED ORDER — CLOPIDOGREL BISULFATE 75 MG PO TABS: 75.0000 mg | ORAL_TABLET | Freq: Every day | ORAL | Status: AC

## 2014-07-20 MED ORDER — SODIUM CHLORIDE 0.9 % IV SOLN
INTRAVENOUS | Status: AC
  Administered 2014-07-20: 13:00:00 via INTRAVENOUS

## 2014-07-20 MED ORDER — MIDAZOLAM HCL 2 MG/2ML IJ SOLN
INTRAMUSCULAR | Status: AC
  Filled 2014-07-20: qty 2

## 2014-07-20 MED ORDER — FENTANYL CITRATE (PF) 100 MCG/2ML IJ SOLN
INTRAMUSCULAR | Status: DC
Start: 2014-07-20 — End: 2014-07-20
  Filled 2014-07-20: qty 2

## 2014-07-20 SURGICAL SUPPLY — 8 items
CATH INFINITI 5FR ANG PIGTAIL (CATHETERS) ×2 IMPLANT
CATH INFINITI 5FR JL4 (CATHETERS) ×2 IMPLANT
CATH INFINITI 5FR JL5 (CATHETERS) ×2 IMPLANT
CATH INFINITI JR4 5F (CATHETERS) ×2 IMPLANT
KIT MANI 3VAL PERCEP (MISCELLANEOUS) ×2 IMPLANT
PACK CARDIAC CATH (CUSTOM PROCEDURE TRAY) ×2 IMPLANT
SHEATH PINNACLE 5F 10CM (SHEATH) ×2 IMPLANT
WIRE EMERALD 3MM-J .035X150CM (WIRE) ×2 IMPLANT

## 2014-07-20 NOTE — Progress Notes (Signed)
PAD in place with 40 ml of air.    At 1000 removed PAD and dressing applied.  Report called to floor nurse doll.

## 2014-07-20 NOTE — Discharge Summary (Signed)
Physician Discharge Summary  Patient ID: [name] MRN: [id number] DOB/AGE: [birth year] 81 y.o.  Admit date: 07/17/2014 Discharge date: 07/20/2014  Admission Diagnoses:  Discharge Diagnoses: Acute non-ST elevation MI 2 x-ray of hypertension 3 restless leg syndrome 4 BPH Active Problems:   Unstable angina   Discharged Condition:  Stable Hospital Course: Joel Mata is 80 several gentleman  with a the past medical history significant for CAD status post previous CABG presented to the margins of complaining of chest tightness patient had some diaphoresis associated with chest tightness. He denied any nausea vomiting patient normally sees Dr. Lady Gary and had undergone a nuclear stress test about 3 months ago which was reportedly normal.  Patient was admitted to [facility] and his troponin level was notably elevated to  13.87 and repeat troponin was 8.74 CK was 42 CK-MB was 41.9 patient was placed on IV heparin and also underwent a cardiac catheter on 07/20/2014 Cardiac catheter performed by Dr. Darrold Junker  shows showed three-vessel CAD with patent LIMA to LAD patent SVG to PDA diffusely diseased SVG to not amenable to PCI LVEF of 64% During his stay in hospital patient remained stable and chest pain-free and it was decided to treat him conservatively with medical management patient was stable at time of discharge please see discharge medications as below he will follow-up with his cardiologist Dr. Marc Morgans in 1-2 weeks' time and also follow me Dr. Marcello Fennel  in which 2 weeks' time he has been advised to call the clinic with any questions or concerns  Consults: Dr. Darrold Junker   Significant Diagnostic Studies: Cardiac Catheterization  Treatments:   Discharge Exam: Blood pressure 171/81, pulse 60, temperature 97.8 F (36.6 C), temperature source Oral, resp. rate 18, height 5' 10.98" (1.803 m), weight 111.449 kg (245 lb 11.2 oz), SpO2 97 %.   Disposition: Discharged Home      Medication List    STOP  taking these medications        aspirin 81 MG tablet  Replaced by:  aspirin 325 MG EC tablet     atorvastatin 40 MG tablet  Commonly known as:  LIPITOR     gabapentin 300 MG capsule  Commonly known as:  NEURONTIN      TAKE these medications        aspirin 325 MG EC tablet  Take 1 tablet (325 mg total) by mouth daily.     beta carotene w/minerals tablet  Take 1 tablet by mouth daily with breakfast.     clopidogrel 75 MG tablet  Commonly known as:  PLAVIX  Take 1 tablet (75 mg total) by mouth daily.     dutasteride 0.5 MG capsule  Commonly known as:  AVODART  Take 0.5 mg by mouth daily.     ferrous sulfate 325 (65 FE) MG tablet  Take 325 mg by mouth 3 (three) times a week.     isosorbide mononitrate 30 MG 24 hr tablet  Commonly known as:  IMDUR  Take 1 tablet (30 mg total) by mouth daily.     lisinopril 30 MG tablet  Commonly known as:  PRINIVIL,ZESTRIL  Take 30 mg by mouth daily.     metoprolol tartrate 25 MG tablet  Commonly known as:  LOPRESSOR  Take 1 tablet (25 mg total) by mouth 2 (two) times daily.     rOPINIRole 1 MG tablet  Commonly known as:  REQUIP  Take 1 mg by mouth at bedtime.         SignedBarbette Reichmann  07/20/2014, 1:07 PM

## 2014-07-20 NOTE — Consult Note (Signed)
PATIENT NAME:  Joel Mata, Joel Mata MR#:  [id number] DATE OF BIRTH:  12-Jan-1935  DATE OF CONSULTATION:  07/18/2014  REFERRING PHYSICIAN:   CONSULTING PHYSICIAN:  Marcina MillardAlexander Chianti Goh, MD  PRIMARY CARE PHYSICIAN:   Barbette ReichmannVishwanath Hande, MD  CARDIOLOGIST:  Harold HedgeKenneth Fath, MD  CHIEF COMPLAINT: Chest pain.   REASON FOR CONSULTATION: Consultation requested for evaluation of non-ST elevation myocardial infarction.   HISTORY OF PRESENT ILLNESS: The patient is a 80 year old gentleman with known history of coronary artery disease, [facility], [facility] approximately 9 years ago. The patient has been in his usual state of health until day of admission when he experienced substernal chest discomfort with radiation to his left arm which occurred at rest. The patient took 3 sublingual nitroglycerin without relief. [facility] Room where initial EKG was nondiagnostic. Admission labs were notable for elevated troponin of a 4.21 with CPK and MB of 123.4 and 93.5, respectively. The patient currently is chest pain-free.   PAST MEDICAL HISTORY: 1. Status post coronary [facility].  2. Status post [facility].  3. Hyperlipidemia.  4. Hypertension.  5. Restless leg syndrome.   MEDICATIONS: Aspirin 325 mg daily, lisinopril 20 mg daily, Lipitor 40 mg daily, Lopressor 25 mg daily, Requip 0.25 mg at bedtime, Avodart 0.5 mg daily.   SOCIAL HISTORY: The patient is married, resides with his wife. Denies tobacco abuse.   FAMILY HISTORY: Positive for coronary artery disease.   REVIEW OF SYSTEMS:     CONSTITUTIONAL: No fever or chills.  EYES: No blurry vision.  EARS: No hearing loss.  RESPIRATORY: No shortness of breath.  CARDIOVASCULAR: Chest pain as described above.  GASTROINTESTINAL: No nausea, vomiting, or diarrhea.  GENITOURINARY: No dysuria or hematuria.  ENDOCRINE: No polyuria or polydipsia.   MUSCULOSKELETAL: No arthralgias or myalgias.  NEUROLOGICAL: No focal muscle weakness or numbness.  PSYCHOLOGICAL: No depression or anxiety.   PHYSICAL EXAMINATION: VITAL SIGNS: Blood pressure 170/91, pulse 66, respirations 16, temperature 97.4, pulse oximetry 98%.  HEENT: Pupils equally reactive to light and accommodation.  NECK: Supple without thyromegaly.  LUNGS: Were clear.  HEART: Normal JVP. Normal PMI. Regular rate and rhythm. Normal S1 and S2. No appreciable gallop, murmur, or rub.  ABDOMEN: Soft, nontender.  PULSES:  Intact bilaterally.  MUSCULOSKELETAL: Normal muscle tone.  NEUROLOGIC:  The patient was alert and oriented. Motor and sensory were both grossly intact.   IMPRESSION: A 80 year old gentleman with known coronary artery disease, history of coronary stent, status post CABG, who presents with prolonged episode of chest pain at rest and has ruled in for non-ST elevation myocardial infarction.   RECOMMENDATIONS: 1. Continue current medications.  2. Continue heparin drip.  3. Review 2D echocardiogram.  4. Cardiac catheterization with selective coronary arteriography scheduled for 07/20/2014. Risks, benefits, and alternatives were explained and informed written consent obtained.    ____________________________ Marcina MillardAlexander Alwilda Gilland, MD ap:tr D: 07/18/2014 08:59:49 ET T: 07/18/2014 12:50:59 ET JOB#: [id number]  cc: Marcina MillardAlexander Kaidance Pantoja, MD, <Dictator> Marcina MillardALEXANDER Jemina Scahill MD ELECTRONICALLY SIGNED 07/18/2014 14:16

## 2014-07-20 NOTE — Progress Notes (Signed)
ANTICOAGULATION CONSULT NOTE - Follow Up Consult  Pharmacy Consult for Heparin Drip Indication: chest pain/ACS  No Known Allergies  Patient Measurements: Height: 5' 10.98" (180.3 cm) Weight: 245 lb 11.2 oz (111.449 kg) IBW/kg (Calculated) : 75.26 Heparin Dosing Weight: 99 kg  Vital Signs: Temp: 98 F (36.7 C) (05/01 2050) Temp Source: Oral (05/01 2050) BP: 169/89 mmHg (05/01 2050) Pulse Rate: 63 (05/01 2050)  Labs:  Recent Labs  07/17/14 1750 07/18/14 0342  07/19/14 0447 07/19/14 0914 07/19/14 2122 07/20/14 0202 07/20/14 0203  HGB 13.3 13.1  --  13.6  --   --   --  12.0*  HCT 38.6* 39.0*  --  40.0  --   --   --  36.2*  PLT 146* 148*  --  134*  --   --   --  132*  APTT 31.8  --   --   --   --   --   --   --   LABPROT 13.3  --   --  13.6  --   --   --   --   INR 1.0  --   --  1.02  --   --   --   --   HEPARINUNFRC  --  0.39  < >  --  0.11* 0.96* 0.80*  --   CREATININE 1.27* 1.08  --  1.13  --   --   --  1.34*  CKTOTAL  --   --   --  402*  --   --   --   --   CKMB  --   --   --  41.9*  --   --   --   --   TROPONINI  --   --   --  13.87*  --   --   --   --   < > = values in this interval not displayed.  Estimated Creatinine Clearance: 58.6 mL/min (by C-G formula based on Cr of 1.34).   Medications:  Scheduled:  . aspirin EC  325 mg Oral Daily  . dutasteride  0.5 mg Oral Daily  . lisinopril  20 mg Oral Daily  . metoprolol tartrate  25 mg Oral BID  . nitroGLYCERIN  2 inch Topical BID  . pantoprazole  40 mg Oral Daily  . rOPINIRole  0.25 mg Oral QHS   Infusions:  . sodium chloride Stopped (07/19/14 0205)  . sodium chloride 75 mL/hr at 07/19/14 1631  . heparin 1,800 Units/hr (07/19/14 1636)    Assessment: 80 yo male on Heparin drip for ACS. Height: 5' 10.98" (180.3 cm) Weight: 245 lb 11.2 oz (111.449 kg) IBW/kg (Calculated) : 75.26 Heparin Dosing Weight: 99 kg  HL at 21:00 = 0.96, however this was drawn 4 hours after bolus administration Recheck 5/2 @  02:00 = 0.80, supratherapeutic  Goal of Therapy:  Heparin level 0.3-0.7 units/ml Monitor platelets by anticoagulation protocol: Yes   Plan:  Patient currently on Heparin drip at 1800 units/hr. Will reduce to heparin 1600 units/hr and recheck 8 hour HL at 12:00  Check anti-Xa level in 8 hours and daily while on heparin  Garlon HatchetJody Franci Oshana, PharmD 07/20/2014 3:55 AM

## 2014-07-20 NOTE — Discharge Instructions (Signed)
Follow up with Dr. Marcello FennelHande and Dr. Lady GaryFath Call the office with any questions or concerns

## 2014-07-20 NOTE — Progress Notes (Signed)
PROGRESS NOTE  Joel Mata:[id number] DOB: 04/17/1934 DOA: 07/17/2014 PCP: Barbette Reichmann, MD  HPI/Recap of past 24 hours: 80 y/o male with hx of CAD- previous CABG approx 9 yrs back admitted with chest pain and  Elevated Troponin. This am underwent Cardiac cath. Currently chest pain free  Assessment/Plan: Active Problems: 1 Chest pain with elevated Troponin consistent with NSTEMI Pt is chest pain free. Cardiac cath shows patent grafts. Discussed case with Dr. Darrold Junker For Medical management. Rec: Plavix and Imdur. Stable for D/c Home later today  2 HTN: Continue Lisinopril 3 RLS: On Ropinirole   Objective: BP 171/81 mmHg  Pulse 60  Temp(Src) 97.8 F (36.6 C) (Oral)  Resp 18  Ht 5' 10.98" (1.803 m)  Wt 111.449 kg (245 lb 11.2 oz)  BMI 34.28 kg/m2  SpO2 97%  Intake/Output Summary (Last 24 hours) at 07/20/14 1243 Last data filed at 07/20/14 1051  Gross per 24 hour  Intake      0 ml  Output   1100 ml  Net  -1100 ml   Filed Weights   07/18/14 0915 07/19/14 0604  Weight: 104.3 kg (229 lb 15 oz) 111.449 kg (245 lb 11.2 oz)    Exam:   General:  NAD  Cardiovascular: S1 S2  Respiratory: Clear to auscultation  Abdomen: Soft. Non tender  Musculoskeletal: C/o Pain in legs and attributes it to RLS  Data Reviewed: Basic Metabolic Panel:  Recent Labs Lab 07/17/14 1750 07/18/14 0342 07/19/14 0447 07/20/14 0203  NA 136 137 140 138  K 4.2 4.6 4.2 4.0  CL 102 105 108 107  CO2 GLUCOSE 121* 120* 106* 99  BUN 23* 20 19 21*  CREATININE 1.27* 1.08 1.13 1.34*  CALCIUM 9.1 9.2 8.9 8.1*   Liver Function Tests:  Recent Labs Lab 07/17/14 1750 07/18/14 0342  AST 23 60*  ALT 24 27  ALKPHOS 55 52  PROT 6.6 6.6  ALBUMIN 3.9 3.8   No results for input(s): LIPASE, AMYLASE in the last 168 hours. No results for input(s): AMMONIA in the last 168 hours. CBC:  Recent Labs Lab 07/17/14 1750 07/18/14 0342 07/19/14 0447 07/20/14 0203    WBC 7.0 9.8 6.7 5.9  NEUTROABS  --  7.6* 4.4 3.4  HGB 13.3 13.1 13.6 12.0*  HCT 38.6* 39.0* 40.0 36.2*  MCV 88 89 88.8 88.7  PLT 146* 148* 134* 132*   Cardiac Enzymes:    Recent Labs Lab 07/19/14 0447 07/20/14 0203  CKTOTAL 402*  --   CKMB 41.9*  --   TROPONINI 13.87* 8.74*   BNP (last 3 results) No results for input(s): BNP in the last 8760 hours.  ProBNP (last 3 results) No results for input(s): PROBNP in the last 8760 hours.  CBG: No results for input(s): GLUCAP in the last 168 hours.  No results found for this or any previous visit (from the past 240 hour(s)).   Studies: No results found.  Scheduled Meds: . aspirin EC  325 mg Oral Daily  . clopidogrel  75 mg Oral Daily  . dutasteride  0.5 mg Oral Daily  . fentaNYL      . heparin      . insulin aspart      . insulin aspart      . lisinopril  20 mg Oral Daily  . metoprolol tartrate  25 mg Oral BID  . midazolam      . nitroGLYCERIN  2 inch Topical BID  . pantoprazole  40 mg Oral Daily  . rOPINIRole  0.25 mg Oral QHS  . sodium chloride  3 mL Intravenous Q12H    Continuous Infusions: . sodium chloride 50 mL/hr at 07/20/14 1241       Lenni Reckner   07/20/2014, 12:43 PM  LOS: 3 days

## 2014-07-20 NOTE — H&P (Signed)
PATIENT NAME:  Joel Mata, Joel Mata MR#:  [id number] DATE OF BIRTH:  Jun 20, 1934  DATE OF ADMISSION:  07/17/2014  CHIEF COMPLAINT: Chest pain.   HISTORY OF PRESENT ILLNESS: This is a 80 year old gentleman with a significant past medical history of CAD, status post stents and then subsequent CABG, who presents tonight for new-onset chest tightness. This pain began at rest, is not associated necessarily with activity, and shortly after the pain began he had left arm radiation. The patient took an antacid without relief and then took 3 sublingual nitroglycerin in sequence without any relief. He denies any associated shortness of breath. He did have some diaphoresis with this chest tightness. The pain is not positional. He denies any nausea or vomiting. He developed a slight headache after taking the nitroglycerin. The patient's first stent was placed 15 years ago and then he had a CABG 9 years ago. He states that he never had a heart attack with any of these episodes of chest pain but was always found to have significant blockages. The patient's cardiologist is [facility]. He just had a nuclear stress test done about 2-1/2 months ago which he was told looked okay at that time. In the ED, he was evaluated and first set of troponins was negative.   LABORATORY DATA: Otherwise look relatively stable; however, given his history being such a typical presentation and his prior known coronary artery disease, the patient is being admitted for chest pain rule-out with cardiology consult in the morning.  PRIMARY MEDICAL DOCTOR: Barbette ReichmannVishwanath Hande, MD with [facility].   PAST MEDICAL HISTORY: CAD status post CABG, restless legs syndrome, BPH, hypertension, hyperlipidemia.   CURRENT MEDICATIONS: Requip 0.25 mg at bedtime, lisinopril 20 mg daily, Lipitor 40 mg daily, Avodart 0.5 mg daily, aspirin 325 mg daily, Lopressor 25 mg b.i.d.   PAST SURGICAL HISTORY: Appendectomy; coronary artery bypass  grafting, this was a triple bypass; and a left carotid endarterectomy.   ALLERGIES: No known drug allergies.   FAMILY HISTORY: CAD and CHF in his mother, and his father died of lung cancer.   SOCIAL HISTORY: He is a never smoker, does not use alcohol or illicit drugs.  REVIEW OF SYSTEMS:  CONSTITUTIONAL: Denies fever, fatigue, or weakness.  EYES: Denies blurred or double vision, pain or redness.  EAR, NOSE, AND THROAT: Denies ear pain, hearing loss, difficulty swallowing.  RESPIRATORY: Denies cough. Denies dyspnea or painful respiration.  CARDIOVASCULAR: Chest tightness. Denies edema or palpitations. Denies syncope.  GASTROINTESTINAL: Denies nausea, vomiting, diarrhea, abdominal pain, constipation.  GENITOURINARY: Denies dysuria, hematuria, or frequency.  ENDOCRINE: Denies nocturia, thyroid problems, heat or cold intolerance. HEMATOLOGIC AND LYMPHATIC: Denies easy bruising, bleeding, swollen glands.  INTEGUMENTARY: Denies acne, rash, or lesion.  MUSCULOSKELETAL: Denies acute arthritis, joint swelling, or gout.  NEUROLOGICAL: Denies numbness, weakness, headache.  PSYCHIATRIC: Denies anxiety, insomnia, or depression.   PHYSICAL EXAMINATION: CONSTITUTIONAL:  VITAL SIGNS: Blood pressure 163/88, pulse 68, temperature 97.8, respirations 18, with 93% oxygen saturations on room air. Of note, his blood pressure when this interviewer went to see him had increased to 190s over 100.  GENERAL: This is an elderly overweight gentleman lying supine in bed in no acute distress.  HEENT: Pupils are equal, round, and reactive to light and accommodation. Extraocular movements were intact. Sclerae were not icteric and he had moist mucosal membranes.  NECK: Thyroid is not enlarged. Neck is supple. No masses. Nontender. No cervical adenopathy. No JVD noted.  RESPIRATORY: Clear to auscultation bilaterally with  no rales, rhonchi, or wheezing. No respiratory distress.  CARDIOVASCULAR: He had a regular rate and  rhythm. No murmurs, rubs, or gallops on exam. Good pedal pulses. No lower extremity edema.  ABDOMEN: Soft, nontender, nondistended. Good bowel sounds.  MUSCULOSKELETAL: Muscular strength 5/5 in all 4 extremities. Full spontaneous range of motion throughout. No cyanosis or clubbing.  SKIN: No rash or lesions. Skin is warm, dry, and intact.  LYMPHATIC: No adenopathy.  NEUROLOGICAL: Cranial nerves intact. Sensation intact throughout. No dysarthria or aphasia.  PSYCHIATRIC: Alert and oriented x 3, cooperative, not confused or agitated.  LABORATORY DATA: White count 7, hemoglobin 13, hematocrit 38.6, platelets 146,000. Sodium 136, potassium 4.2, chloride 102, bicarbonate 26, BUN 23, creatinine 1.27, glucose 121, calcium 9.1, total protein 6.6, albumin 3.9, total bilirubin 0.6, alkaline phosphatase 55, AST 23, ALT 24. Troponin less than 0.03. INR of 1. UA was negative.   RADIOLOGY: Chest x-ray showed no edema or consolidation and minimal scarring of the left base.   ASSESSMENT AND PLAN:  1.  Unstable angina. This patient gives a very typical history for cardiac pain. We will trend his cardiac enzymes. His chest pain has improved but not entirely resolved. We will try some nitroglycerin paste to control his pain. We will also control his blood pressure, as it is currently very elevated. We will also try to control his heart rate overnight tonight. We will start him on a heparin drip and get a cardiology consult in the morning.  2.  Accelerated hypertension. The patient has a known history of hypertension; however, his blood pressure is significantly elevated at this time. Due to his unstable angina we want to control his blood pressure. We will use intravenous p.r.n. medications along with his home medications as needed to keep his blood pressure less than 160/90.  3.  Restless legs syndrome. This is a chronic stable problem. We will continue his home medications for this.  4.  Benign prostatic hypertrophy.  Again, this is a chronic stable problem and we will continue his home medications for this.  5.  Deep vein thrombosis prophylaxis. The patient is fully anticoagulated with a heparin drip tonight.  CODE STATUS: Full code.  TIME SPENT ON THIS ADMISSION: 45 minutes.    ____________________________ Candace Cruise. Anne Hahn, MD dfw:ST D: 07/17/2014 22:00:36 ET T: 07/17/2014 23:13:16 ET JOB#: [id number]  cc: Candace Cruise. Anne Hahn, MD, <Dictator> Quaneshia Wareing [address]ty [address] MD ELECTRONICALLY SIGNED 07/18/2014 2:34

## 2014-07-20 NOTE — Progress Notes (Signed)
VSS. NSR. Room air. Heparin drip stopped. IVF going. No pain. R groin site is clear of bleeding and no hematoma. Site education given. IV and tele removed. Discharge instructions given to pt. Prescriptions given to pt. Pt has no further concerns at this time.

## 2014-07-27 ENCOUNTER — Encounter: Payer: Self-pay | Admitting: Cardiology

## 2018-01-30 ENCOUNTER — Other Ambulatory Visit: Payer: Self-pay | Admitting: Internal Medicine

## 2018-01-30 DIAGNOSIS — G2581 Restless legs syndrome: Secondary | ICD-10-CM

## 2018-01-30 DIAGNOSIS — E782 Mixed hyperlipidemia: Secondary | ICD-10-CM

## 2018-01-30 DIAGNOSIS — I10 Essential (primary) hypertension: Secondary | ICD-10-CM

## 2018-01-30 DIAGNOSIS — M25512 Pain in left shoulder: Secondary | ICD-10-CM

## 2018-01-30 DIAGNOSIS — I25810 Atherosclerosis of coronary artery bypass graft(s) without angina pectoris: Secondary | ICD-10-CM

## 2018-01-30 DIAGNOSIS — G8911 Acute pain due to trauma: Secondary | ICD-10-CM

## 2018-01-30 DIAGNOSIS — Z0000 Encounter for general adult medical examination without abnormal findings: Secondary | ICD-10-CM

## 2018-01-30 DIAGNOSIS — Z9181 History of falling: Secondary | ICD-10-CM

## 2018-02-05 ENCOUNTER — Ambulatory Visit
Admission: RE | Admit: 2018-02-05 | Discharge: 2018-02-05 | Disposition: A | Discharge status: Home / Self Care | Payer: Medicare Other | Source: Ambulatory Visit | Attending: Internal Medicine | Admitting: Internal Medicine

## 2018-02-05 DIAGNOSIS — X58XXXA Exposure to other specified factors, initial encounter: Secondary | ICD-10-CM | POA: Insufficient documentation

## 2018-02-05 DIAGNOSIS — Z9181 History of falling: Secondary | ICD-10-CM | POA: Diagnosis not present

## 2018-02-05 DIAGNOSIS — G2581 Restless legs syndrome: Secondary | ICD-10-CM | POA: Diagnosis not present

## 2018-02-05 DIAGNOSIS — M7552 Bursitis of left shoulder: Secondary | ICD-10-CM | POA: Insufficient documentation

## 2018-02-05 DIAGNOSIS — E782 Mixed hyperlipidemia: Secondary | ICD-10-CM | POA: Diagnosis not present

## 2018-02-05 DIAGNOSIS — I25810 Atherosclerosis of coronary artery bypass graft(s) without angina pectoris: Secondary | ICD-10-CM

## 2018-02-05 DIAGNOSIS — G8911 Acute pain due to trauma: Secondary | ICD-10-CM | POA: Diagnosis present

## 2018-02-05 DIAGNOSIS — S46012A Strain of muscle(s) and tendon(s) of the rotator cuff of left shoulder, initial encounter: Secondary | ICD-10-CM | POA: Insufficient documentation

## 2018-02-05 DIAGNOSIS — I10 Essential (primary) hypertension: Secondary | ICD-10-CM | POA: Diagnosis not present

## 2018-02-05 DIAGNOSIS — Z0000 Encounter for general adult medical examination without abnormal findings: Secondary | ICD-10-CM

## 2018-02-05 DIAGNOSIS — M25512 Pain in left shoulder: Secondary | ICD-10-CM | POA: Diagnosis present

## 2019-03-22 IMAGING — MR MR SHOULDER*L* W/O CM
5 series · 32 of 40 positions shown · non-contrast
Comparison: None.

CLINICAL DATA: Left shoulder pain status post fall 2 weeks ago

EXAM:
MRI OF THE LEFT SHOULDER WITHOUT CONTRAST
TECHNIQUE: Multiplanar, multisequence MR imaging of the shoulder was performed.
No intravenous contrast was administered.

[Series 5: PD fat-sat · axial · left · 4.0mm · 0.55mm/px · z∈[-59,+71]mm · 8 of 28 slices shown (1 of 2)]
[im 1/28]
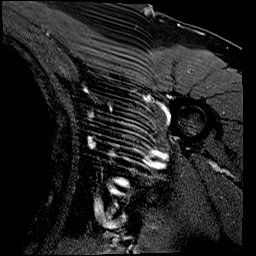
[im 4/28]
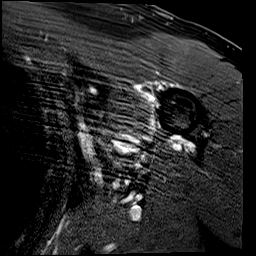
[im 10/28]
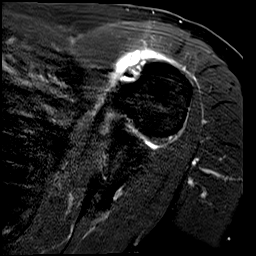
[im 13/28]
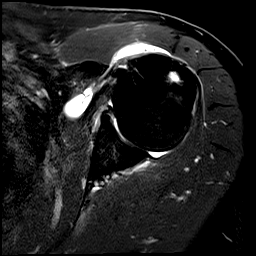
[im 16/28]
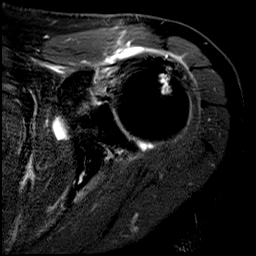
[im 19/28]
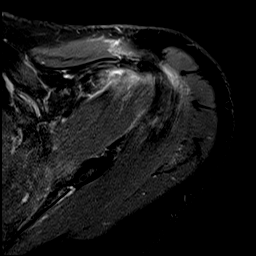
[im 25/28]
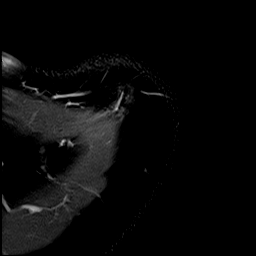
[im 28/28]
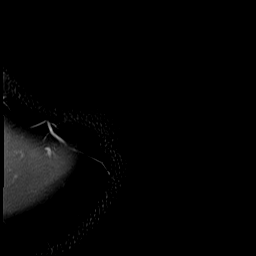

[Series 6: PD fat-sat · oblique · left · 4.0mm · 0.44mm/px · 8 of 26 slices shown (2 of 2)]
[im 1/26]
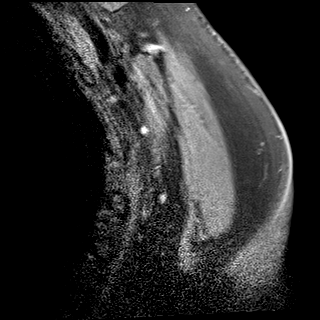
[im 4/26]
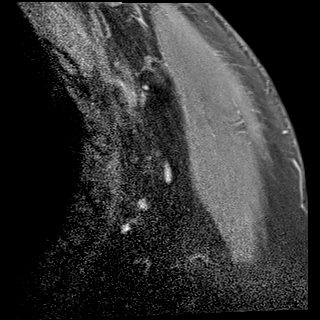
[im 8/26]
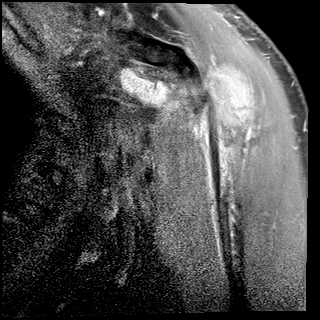
[im 11/26]
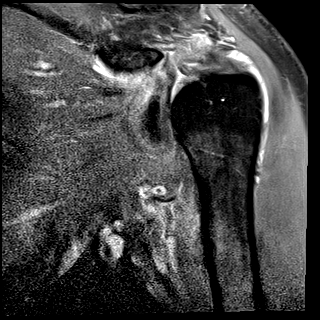
[im 15/26]
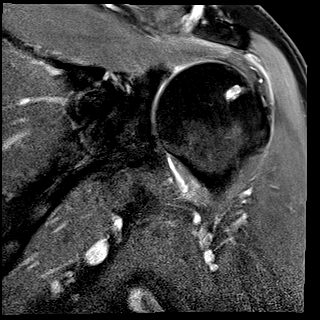
[im 18/26]
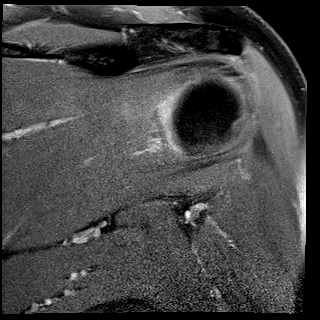
[im 22/26]
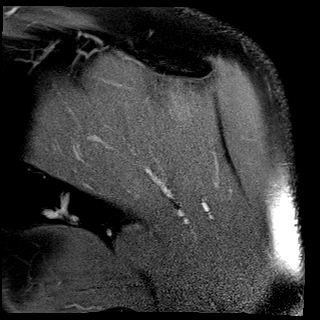
[im 26/26]
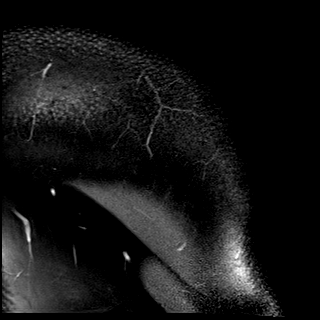

[Series 7: T2 fat-sat · oblique · left · 4.0mm · 0.44mm/px · 8 of 26 slices shown (1 of 2)]
[im 1/26]
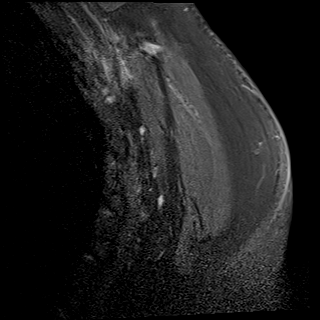
[im 4/26]
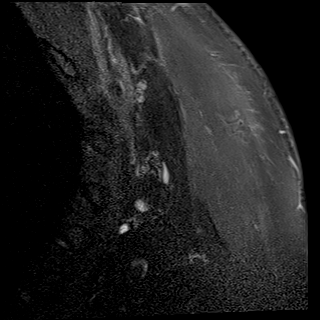
[im 8/26]
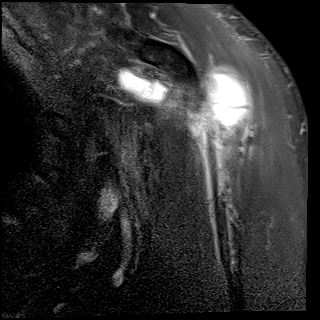
[im 11/26]
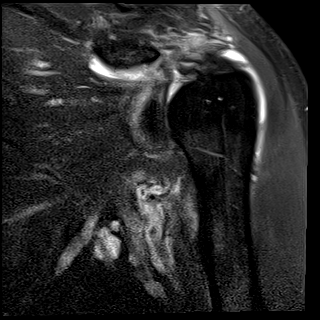
[im 15/26]
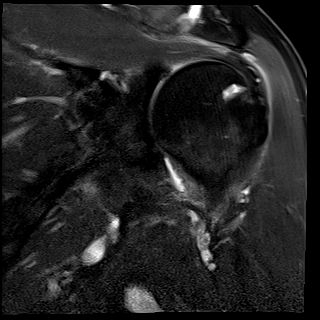
[im 18/26]
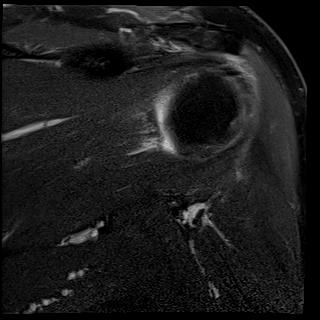
[im 22/26]
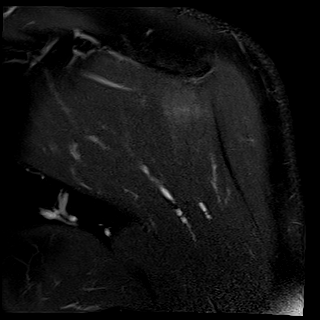
[im 26/26]
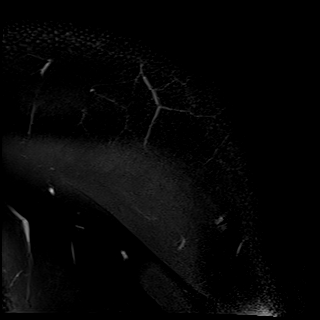

[Series 8: T2 fat-sat · oblique · left · 4.0mm · 0.23mm/px · 7 of 22 slices shown (2 of 2)]
[im 1/22]
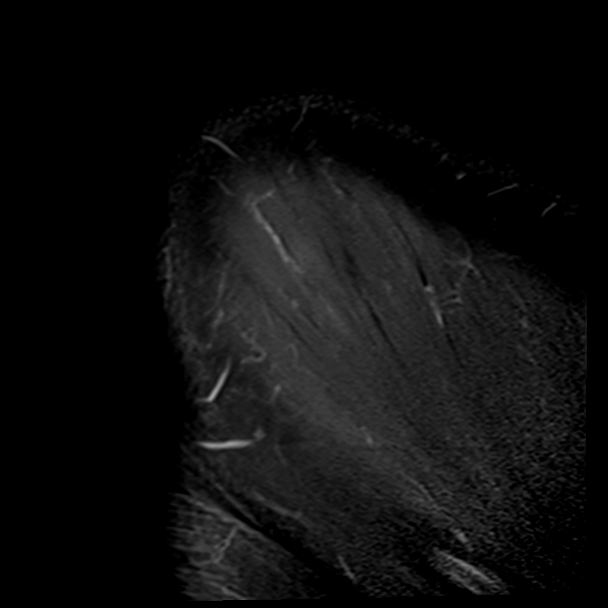
[im 4/22]
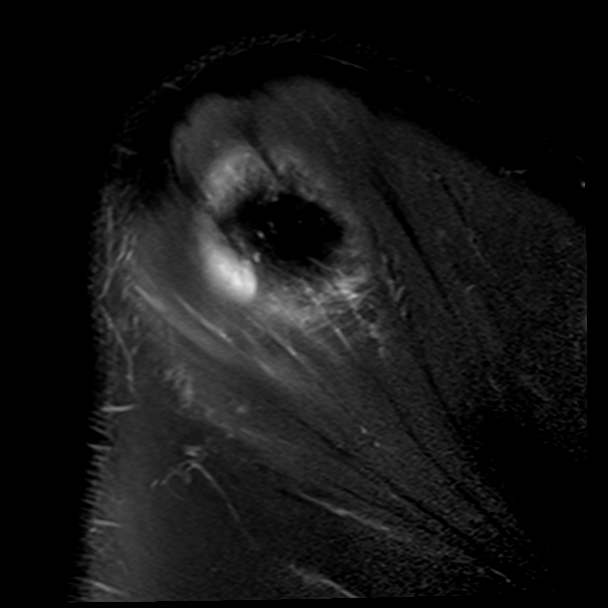
[im 8/22]
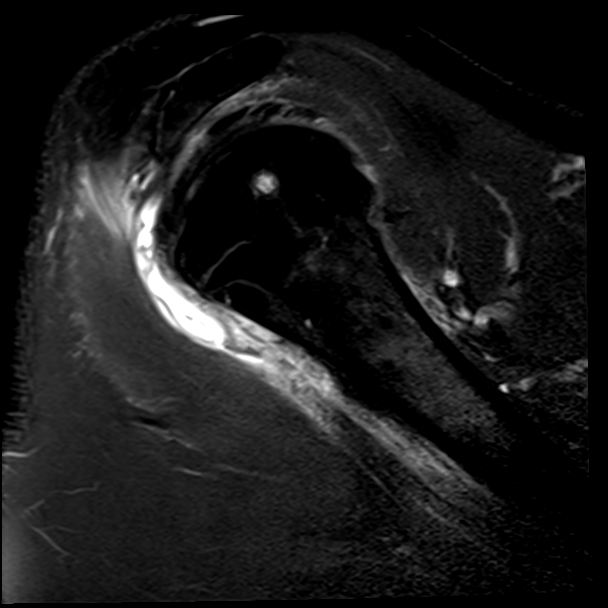
[im 11/22]
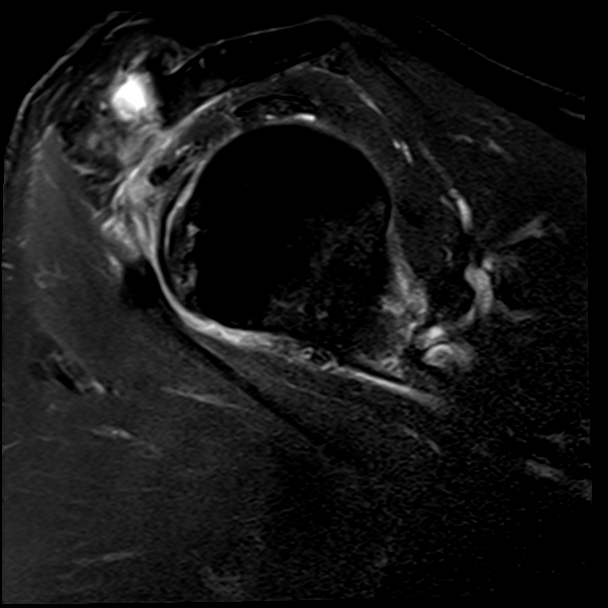
[im 15/22]
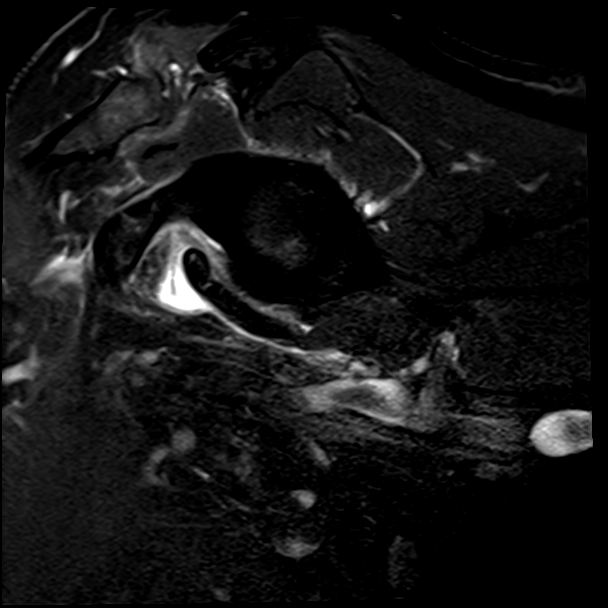
[im 18/22]
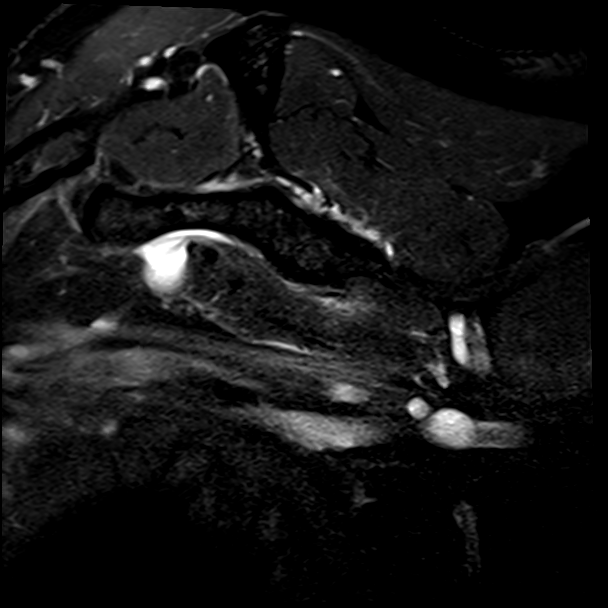
[im 22/22]
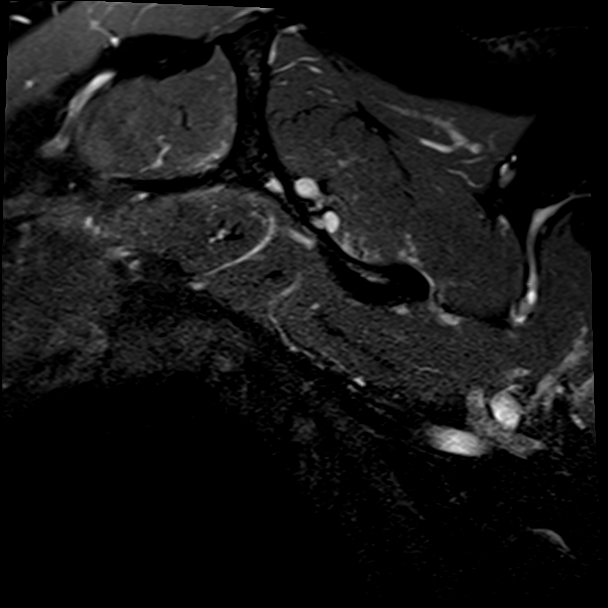

[Series 9: T1 · oblique · left · 4.0mm · 0.36mm/px · 1 of 22 slices shown]
[im 1/22]
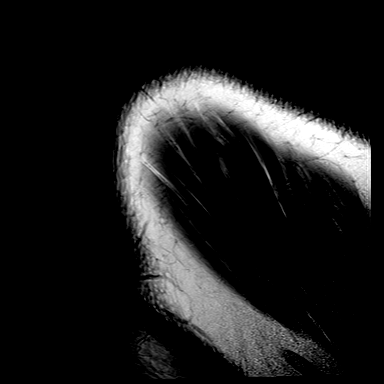

[32 of 40 positions shown; findings below may reference images not displayed]

FINDINGS: Rotator cuff: Moderate tendinosis of the supraspinatus tendon with
fraying along the bursal surface. Mild tendinosis of the
infraspinatus tendon. Teres minor tendon is intact. Mild tendinosis
of the subscapularis tendon with a small partial tear.

Muscles: No atrophy or fatty replacement of nor abnormal signal
within, the muscles of the rotator cuff.

Biceps long head: Mild tendinosis of the intra-articular portion and
proximal extra-articular portion of the long head of the biceps
tendon.

Acromioclavicular Joint: Severe arthropathy of the acromioclavicular
joint. Type II acromion. Small amount of subacromial/subdeltoid
bursal fluid.

Glenohumeral Joint: No joint effusion. No chondral defect.

Labrum:  Superior labral degeneration.

Bones:  No acute osseous abnormality.  No aggressive osseous lesion.

Other: No fluid collection or hematoma.
IMPRESSION: 1. Moderate tendinosis of the supraspinatus tendon with fraying
along the bursal surface.
2. Mild tendinosis of the infraspinatus tendon.
3. Mild tendinosis of the subscapularis tendon with a small partial
tear.
4. Mild tendinosis of the intra-articular portion and proximal
extra-articular portion of the long head of the biceps tendon.
5. Severe arthropathy of the acromioclavicular joint. Mild
subacromial/subdeltoid bursitis.

## 2021-07-11 ENCOUNTER — Other Ambulatory Visit: Payer: Self-pay | Admitting: Family Medicine

## 2021-07-11 ENCOUNTER — Ambulatory Visit
Admission: RE | Admit: 2021-07-11 | Discharge: 2021-07-11 | Disposition: A | Discharge status: Home / Self Care | Payer: Medicare Other | Source: Ambulatory Visit | Attending: Family Medicine | Admitting: Family Medicine

## 2021-07-11 DIAGNOSIS — M79672 Pain in left foot: Secondary | ICD-10-CM

## 2021-07-11 DIAGNOSIS — M7989 Other specified soft tissue disorders: Secondary | ICD-10-CM | POA: Diagnosis present

## 2021-10-03 ENCOUNTER — Ambulatory Visit: Payer: Medicare Other | Admitting: Dermatology

## 2021-10-12 ENCOUNTER — Ambulatory Visit: Payer: Medicare Other | Admitting: Dermatology

## 2021-12-07 ENCOUNTER — Other Ambulatory Visit: Payer: Self-pay

## 2021-12-07 ENCOUNTER — Ambulatory Visit
Admission: RE | Admit: 2021-12-07 | Discharge: 2021-12-07 | Disposition: A | Discharge status: Home / Self Care | Payer: Self-pay | Source: Ambulatory Visit | Attending: Orthopedic Surgery | Admitting: Orthopedic Surgery

## 2021-12-07 DIAGNOSIS — Z049 Encounter for examination and observation for unspecified reason: Secondary | ICD-10-CM

## 2021-12-07 NOTE — Progress Notes (Addendum)
Referring Physician:  Reinaldo Berber, MD [address] [place],  [place] [address]  Primary Physician:  Barbette Reichmann, MD  History of Present Illness:  Histoyr of BPH, CAD with stents, HTN, PAD, RLS.   He is on PLAVIX.   Seen by ortho Audelia Acton) on 12/05/21 for right hip pain with known moderate hip OA per his note. Had diagnostic hip injection (with lidocaine and bupivacaine) on 12/05/21 as well with 50-60% reduction in pain per ortho notes.   Here today for evaluation of his lumbar spine prior to consideration of hip surgery.   He started with severe right lateral knee pain to his mid thigh about a week ago. No known injury. Pain was constant. He had difficulty walking due to pain. No LBP or pain below his knee. No weakness in leg. Felt some instability in his hip.   No previous issues with his hips. Had noticed some discomfort in right buttock with prolonged driving. Pain improved after he got out of the car.   Bowel/Bladder Dysfunction: none  Conservative measures:  Physical therapy: no   Multimodal medical therapy including regular antiinflammatories: tylenol, prednisone, neurontin, zanaflex Injections:  diagnostic hip injection on 12/05/21 with 50-60% reduction in pain.   No epidural steroid injections  Past Surgery: no spine surgery  Joel Mata has no symptoms of cervical myelopathy.  The symptoms are causing a significant impact on the patient's life.   Review of Systems:  A 10 point review of systems is negative, except for the pertinent positives and negatives detailed in the HPI.  Past Medical History: Past Medical History:  Diagnosis Date   BPH (benign prostatic hyperplasia)    CAD (coronary artery disease)    Carotid stenosis    Hyperlipidemia    Hypertension    PAD (peripheral artery disease) (HCC)    RLS (restless legs syndrome)     Past Surgical History: Past Surgical History:  Procedure Laterality Date   APPENDECTOMY     CARDIAC  CATHETERIZATION N/A 07/20/2014   Procedure: Left Heart Cath and Cors/Grafts Angiography;  Surgeon: Marcina Millard, MD;  Location: [facility] INVASIVE CV LAB;  Service: Cardiovascular;  Laterality: N/A;   CATARACT EXTRACTION Bilateral    CORONARY ARTERY BYPASS GRAFT  2009   ENDOSCOPIC CARPAL TUNNEL RELEASE Left 2005   Left CEA  2006    Allergies: Allergies as of 12/08/2021   (No Known Allergies)    Medications: Outpatient Encounter Medications as of 12/08/2021  Medication Sig   Acetaminophen (TYLENOL PO) Take by mouth as needed.   aspirin EC 81 MG tablet Take 81 mg by mouth daily. Swallow whole.   finasteride (PROSCAR) 5 MG tablet Take 1 tablet by mouth 3 (three) times a week.   atorvastatin (LIPITOR) 40 MG tablet Take 40 mg by mouth daily.   beta carotene w/minerals (OCUVITE) tablet Take 1 tablet by mouth daily with breakfast.   clopidogrel (PLAVIX) 75 MG tablet Take 1 tablet (75 mg total) by mouth daily.   dutasteride (AVODART) 0.5 MG capsule Take 0.5 mg by mouth daily.   ferrous sulfate 325 (65 FE) MG tablet Take 325 mg by mouth 3 (three) times a week.   gabapentin (NEURONTIN) 100 MG capsule Take 300 mg by mouth at bedtime.   isosorbide mononitrate (IMDUR) 30 MG 24 hr tablet Take 1 tablet (30 mg total) by mouth daily.   lisinopril (PRINIVIL,ZESTRIL) 30 MG tablet Take 30 mg by mouth daily.   metoprolol tartrate (LOPRESSOR) 25 MG tablet Take 1  tablet (25 mg total) by mouth 2 (two) times daily.   omeprazole (PRILOSEC) 40 MG capsule Take 40 mg by mouth daily as needed.   predniSONE (DELTASONE) 20 MG tablet Take 40 mg by mouth daily.   rOPINIRole (REQUIP) 1 MG tablet Take 1 mg by mouth at bedtime.   tiZANidine (ZANAFLEX) 4 MG tablet Take 4 mg by mouth 3 (three) times daily.   [DISCONTINUED] aspirin EC 325 MG EC tablet Take 1 tablet (325 mg total) by mouth daily.   No facility-administered encounter medications on file as of 12/08/2021.    Social History: Social History   Tobacco Use    Smoking status: Never   Smokeless tobacco: Never  Vaping Use   Vaping Use: Never used    Family Medical History: History reviewed. No pertinent family history.  Physical Examination: Vitals:   12/08/21 1312  BP: (!) 152/81  Pulse: 61    General: Patient is well developed, well nourished, calm, collected, and in no apparent distress. Attention to examination is appropriate.  Respiratory: Patient is breathing without any difficulty.   NEUROLOGICAL:     Awake, alert, oriented to person, place, and time.  Speech is clear and fluent. Fund of knowledge is appropriate.   Cranial Nerves: Pupils equal round and reactive to light.  Facial tone is symmetric.  Facial sensation is symmetric.   Strength: Side Biceps Triceps Deltoid Interossei Grip Wrist Ext. Wrist Flex.  R 5 5 5 5 5 5 5   L 5 5 5 5 5 5 5    Side Iliopsoas Quads Hamstring PF DF EHL  R 5 5 5 5 5 5   L 5 5 5 5 5 5    Reflexes are 2+ and symmetric at the biceps, triceps, brachioradialis, patella and achilles.   Hoffman's is absent.  Clonus is not present.   Bilateral upper and lower extremity sensation is intact to light touch.     No pain with IR/ER of right hip.   Gait is normal.    Medical Decision Making  Imaging: No imaging to review.   Per ortho notes, xrays of right hip on 12/05/21 showed reveal moderate degenerative arthritis  with joint space narrowing and osteophyte formation, with osteophyte formation at the greater troches insertion, some early subchondral cystic  change and sclerosis with inferior medial bone-on-bone contact.    Assessment and Plan: Mr. Fielden is a pleasant 87 y.o. male with severe right lateral knee pain to his mid thigh that started about a week ago. No LBP or pain below his knee.   Had diagnostic right hip injection on 12/05/21 with significant improvement in his pain. He still has some mild right lateral hip pain to mid thigh. No groin pain. No LBP or pain below his knee.   Exam  today shows no pain with IR/ER of right hip.   Above treatment options discussed with patient and following plan made:   - Will get lumbar xrays and call him with results.  - At this point, I think more of his symptoms were hip mediated as he had so much improvement with hip injection.  - Depending on xray results and symptoms, may consider PT. Will regroup once lumbar xrays are done.  - Okay to continue OTC tylenol as directed on bottle. He is on PLAVIX so would avoid other NSAIDs.   I spent a total of 30 minutes in face-to-face and non-face-to-face activities related to this patient's care today.  Thank you for involving me in the care  of this patient.   ADDENDUM 12/13/21:  Lumbar xrays 12/08/21:  FINDINGS: No fracture or dislocation of the lumbar spine. Straightening of the normal lumbar lordosis. Severe multilevel disc and facet degenerative change throughout the lumbar spine, worst from L3-S1. Large anterior bridging osteophytes throughout, with partial ankylosis seen from the included lower thoracic levels through L2. No dynamic listhesis with flexion extension views. Nonobstructive pattern of overlying bowel gas. Aortic atherosclerosis.   IMPRESSION: 1. No fracture or dislocation of the lumbar spine. 2. Severe multilevel disc and facet degenerative change throughout the lumbar spine, worst from L3-S1. 3. Large anterior bridging osteophytes throughout lumbar spine, with partial ankylosis seen from the included lower thoracic levels through L2 consistent with DISH. 4. Lumbar disc and neural foraminal pathology may be further evaluated by MRI if indicated by neurologically localizing signs and symptoms.     Electronically Signed   By: Delanna Ahmadi M.D.   On: 12/10/2021 21:07  I have personally reviewed the images and agree with the above interpretation.  Spoke with patient today. He is doing well and pain has not come back. We discussed xray results.   I still think  that his pain was likely from his hip since improved so much after the injection. Can consider MRI of lumbar spine if he gets worse. Patient will let me know. Message to ortho Karel Jarvis).   Geronimo Boot PA-C Dept. of Neurosurgery

## 2021-12-08 ENCOUNTER — Ambulatory Visit
Admission: RE | Admit: 2021-12-08 | Discharge: 2021-12-08 | Disposition: A | Discharge status: Home / Self Care | Payer: Medicare Other | Source: Ambulatory Visit | Attending: Orthopedic Surgery | Admitting: Orthopedic Surgery

## 2021-12-08 ENCOUNTER — Ambulatory Visit: Payer: Medicare Other | Admitting: Orthopedic Surgery

## 2021-12-08 ENCOUNTER — Encounter: Payer: Self-pay | Admitting: Orthopedic Surgery

## 2021-12-08 ENCOUNTER — Ambulatory Visit
Admission: RE | Admit: 2021-12-08 | Discharge: 2021-12-08 | Disposition: A | Discharge status: Home / Self Care | Payer: Medicare Other | Attending: Orthopedic Surgery | Admitting: Orthopedic Surgery

## 2021-12-08 VITALS — BP 152/81 | HR 61 | Ht 71.0 in | Wt 233.6 lb

## 2021-12-08 DIAGNOSIS — M79604 Pain in right leg: Secondary | ICD-10-CM | POA: Insufficient documentation

## 2021-12-08 DIAGNOSIS — M25551 Pain in right hip: Secondary | ICD-10-CM | POA: Insufficient documentation

## 2021-12-08 DIAGNOSIS — M25561 Pain in right knee: Secondary | ICD-10-CM

## 2021-12-12 ENCOUNTER — Telehealth: Payer: Self-pay

## 2021-12-12 NOTE — Telephone Encounter (Signed)
Left message to return call. Did not leave any details on the machine b/c it didn't have any identifying information.

## 2021-12-12 NOTE — Telephone Encounter (Signed)
-----   Message from Geronimo Boot, [place] sent at 12/12/2021 10:13 AM EDT ----- Can you please call to check on him. Is his pain still better? Please let me know.   You can let him know that I will call him tomorrow about his lumbar xrays.   Thanks.

## 2021-12-13 NOTE — Telephone Encounter (Signed)
Joel Mata spoke with the patient.

## 2022-08-25 IMAGING — US US EXTREM LOW VENOUS*L*
1 series · 13 of 24 positions shown · non-contrast
Comparison: None.

CLINICAL DATA: Left foot pain and swelling

EXAM:
LEFT LOWER EXTREMITY VENOUS DOPPLER ULTRASOUND
TECHNIQUE: Gray-scale sonography with compression, as well as color and duplex
ultrasound, were performed to evaluate the deep venous system(s)
from the level of the common femoral vein through the popliteal and
proximal calf veins.

[Series 1: us venous img lower uni left (dvt) · portal-venous · 13 of 45 slices shown]
[im 1/45]
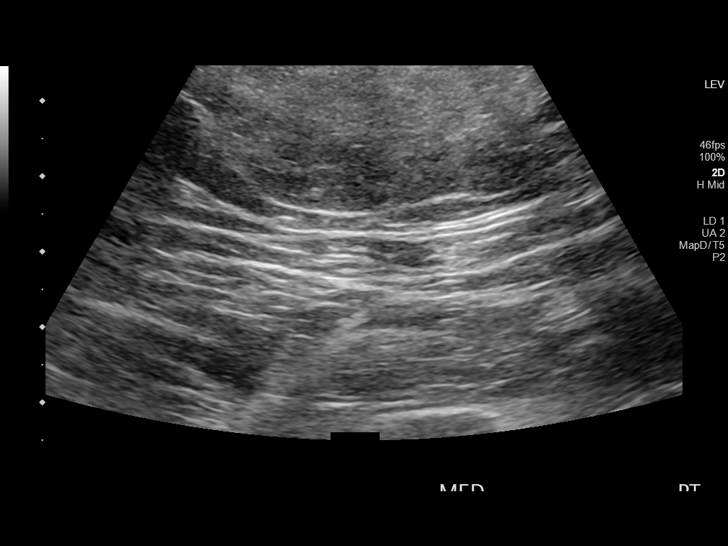
[im 4/45]
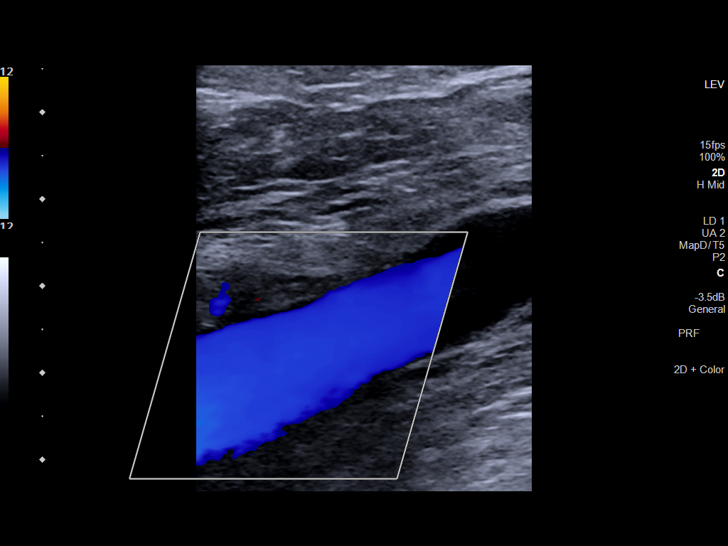
[im 8/45]
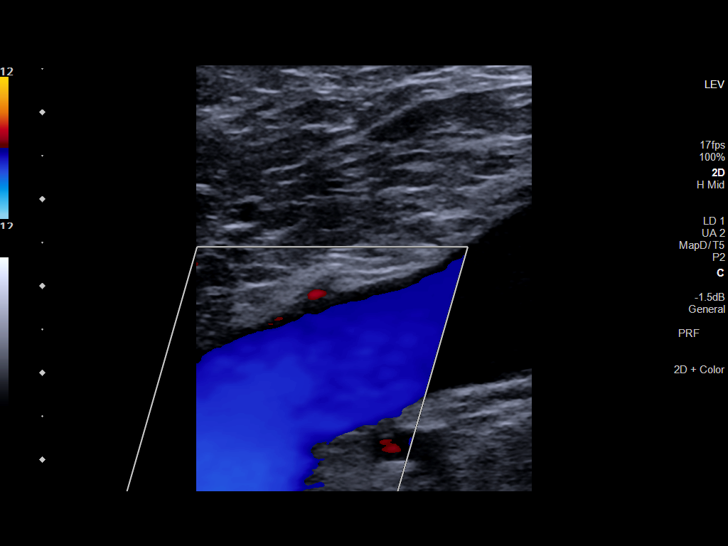
[im 12/45]
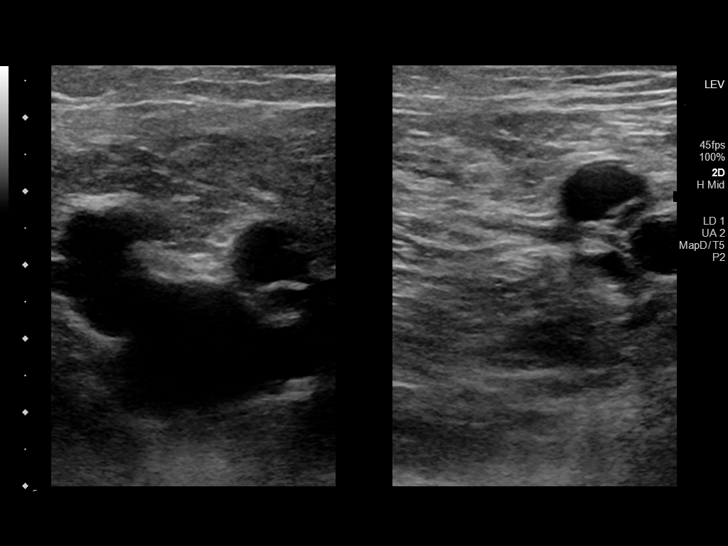
[im 16/45]
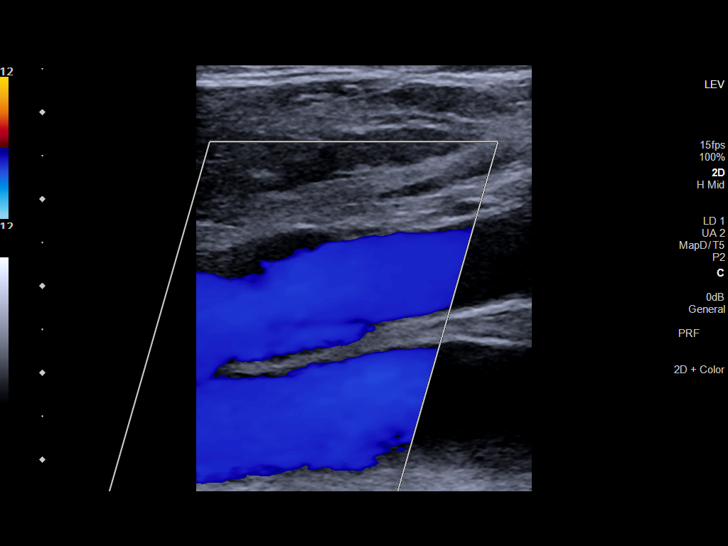
[im 20/45]
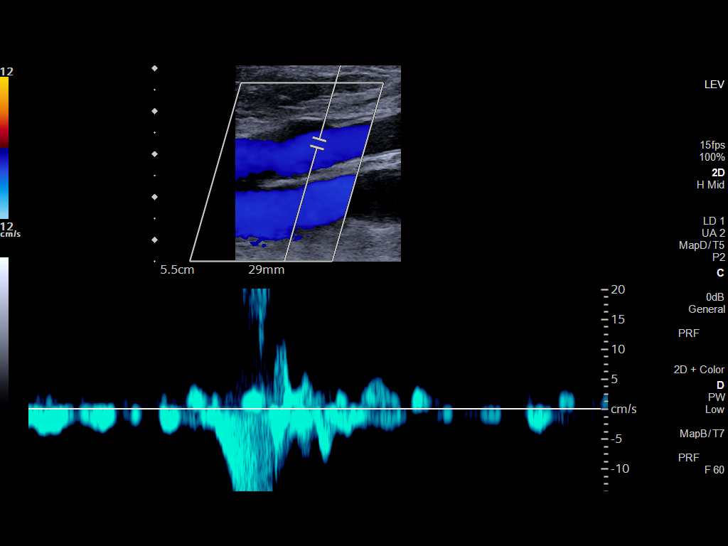
[im 23/45]
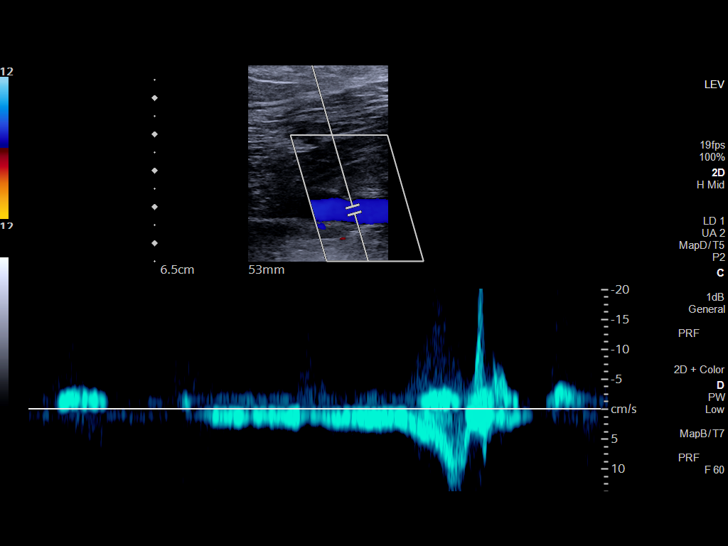
[im 25/45]
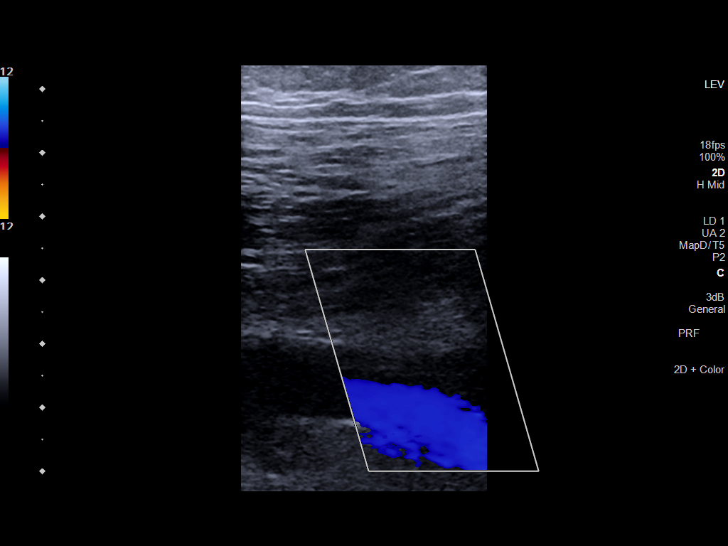
[im 29/45]
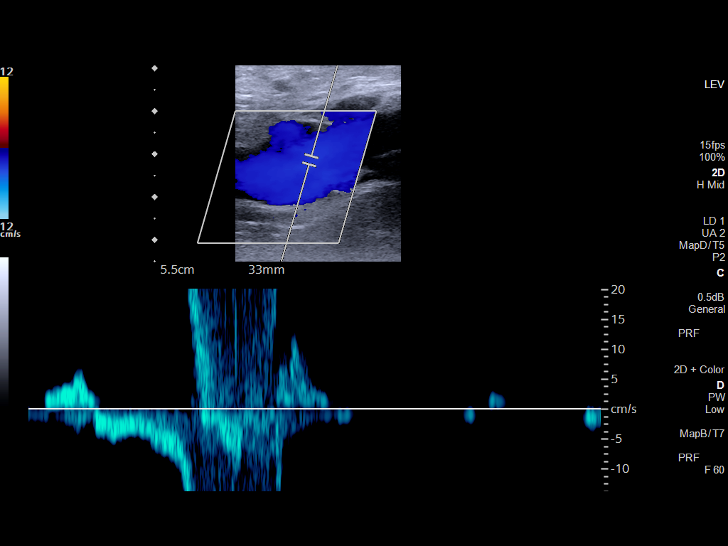
[im 33/45]
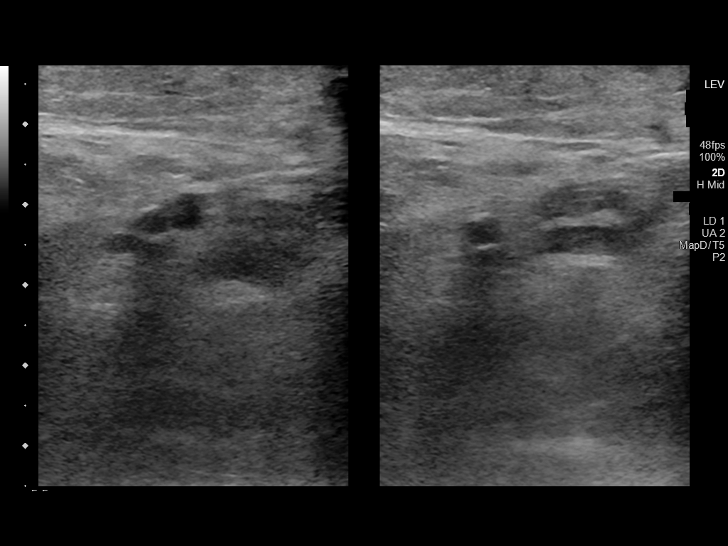
[im 37/45]
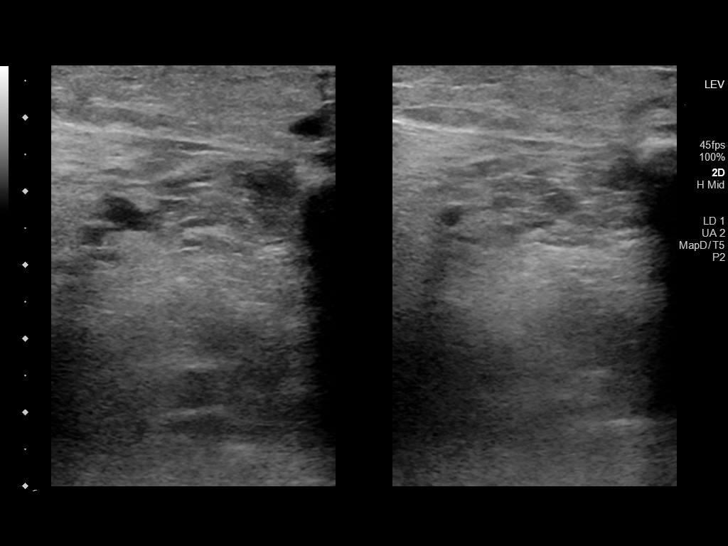
[im 41/45]
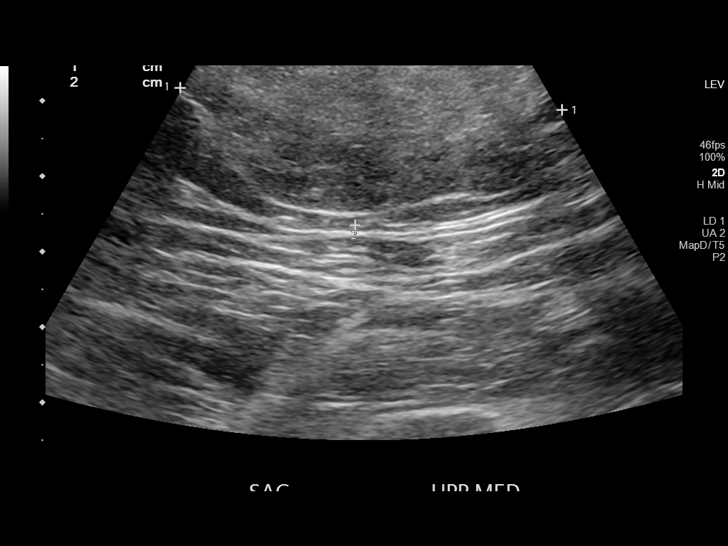
[im 45/45]
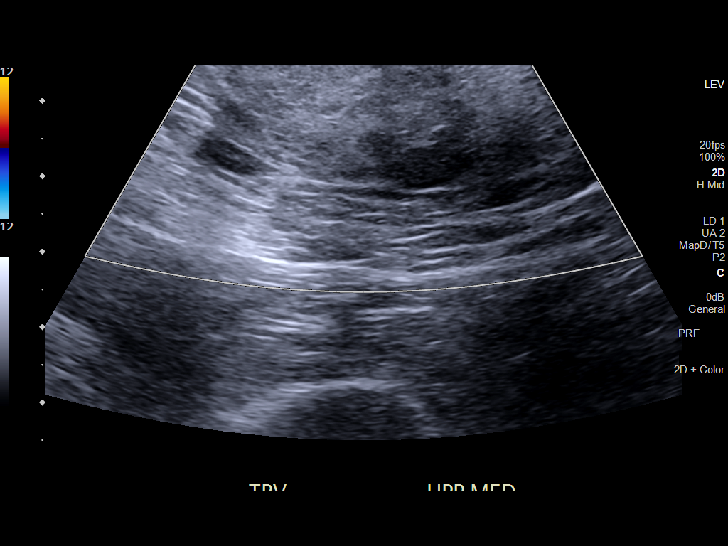

[13 of 24 positions shown; findings below may reference images not displayed]

FINDINGS: VENOUS

Normal compressibility of the common femoral, superficial femoral,
and popliteal veins, as well as the visualized calf veins.
Visualized portions of profunda femoral vein and great saphenous
vein unremarkable. No filling defects to suggest DVT on grayscale or
color Doppler imaging. Doppler waveforms show normal direction of
venous flow, normal respiratory plasticity and response to
augmentation.

Limited views of the contralateral common femoral vein are
unremarkable.

OTHER

5.1 x 2.6 x 5 cm echogenic mass in the subcutaneous fat of the left
upper thigh medially partially visualized likely reflecting a
lipoma.

Limitations: none
IMPRESSION: 1. No evidence of deep venous thrombosis.
2. 5.1 x 2.6 x 5 cm echogenic mass in the subcutaneous fat of the
left upper thigh medially partially visualized likely reflecting a
lipoma.

## 2023-07-27 ENCOUNTER — Observation Stay

## 2023-07-27 ENCOUNTER — Observation Stay
Admission: EM | Admit: 2023-07-27 | Discharge: 2023-07-29 | DRG: 640 | Disposition: A | Discharge status: Home / Self Care | Attending: Emergency Medicine | Admitting: Emergency Medicine

## 2023-07-27 ENCOUNTER — Emergency Department

## 2023-07-27 ENCOUNTER — Observation Stay
Admit: 2023-07-27 | Discharge: 2023-07-27 | Disposition: A | Discharge status: Home / Self Care | Attending: Family Medicine | Admitting: Family Medicine

## 2023-07-27 ENCOUNTER — Other Ambulatory Visit: Payer: Self-pay

## 2023-07-27 ENCOUNTER — Encounter: Payer: Self-pay | Admitting: *Deleted

## 2023-07-27 DIAGNOSIS — I2510 Atherosclerotic heart disease of native coronary artery without angina pectoris: Secondary | ICD-10-CM | POA: Diagnosis present

## 2023-07-27 DIAGNOSIS — Z7982 Long term (current) use of aspirin: Secondary | ICD-10-CM

## 2023-07-27 DIAGNOSIS — R935 Abnormal findings on diagnostic imaging of other abdominal regions, including retroperitoneum: Secondary | ICD-10-CM

## 2023-07-27 DIAGNOSIS — E860 Dehydration: Secondary | ICD-10-CM | POA: Diagnosis present

## 2023-07-27 DIAGNOSIS — G8929 Other chronic pain: Secondary | ICD-10-CM | POA: Diagnosis present

## 2023-07-27 DIAGNOSIS — I739 Peripheral vascular disease, unspecified: Secondary | ICD-10-CM | POA: Diagnosis present

## 2023-07-27 DIAGNOSIS — I214 Non-ST elevation (NSTEMI) myocardial infarction: Secondary | ICD-10-CM | POA: Diagnosis not present

## 2023-07-27 DIAGNOSIS — E66811 Obesity, class 1: Secondary | ICD-10-CM

## 2023-07-27 DIAGNOSIS — N179 Acute kidney failure, unspecified: Secondary | ICD-10-CM | POA: Diagnosis not present

## 2023-07-27 DIAGNOSIS — R7989 Other specified abnormal findings of blood chemistry: Secondary | ICD-10-CM

## 2023-07-27 DIAGNOSIS — Z6833 Body mass index (BMI) 33.0-33.9, adult: Secondary | ICD-10-CM

## 2023-07-27 DIAGNOSIS — Z7902 Long term (current) use of antithrombotics/antiplatelets: Secondary | ICD-10-CM

## 2023-07-27 DIAGNOSIS — M545 Low back pain, unspecified: Secondary | ICD-10-CM

## 2023-07-27 DIAGNOSIS — E871 Hypo-osmolality and hyponatremia: Secondary | ICD-10-CM | POA: Diagnosis not present

## 2023-07-27 DIAGNOSIS — E785 Hyperlipidemia, unspecified: Secondary | ICD-10-CM

## 2023-07-27 DIAGNOSIS — N400 Enlarged prostate without lower urinary tract symptoms: Secondary | ICD-10-CM | POA: Diagnosis present

## 2023-07-27 DIAGNOSIS — Z79899 Other long term (current) drug therapy: Secondary | ICD-10-CM

## 2023-07-27 DIAGNOSIS — I10 Essential (primary) hypertension: Secondary | ICD-10-CM

## 2023-07-27 DIAGNOSIS — I959 Hypotension, unspecified: Principal | ICD-10-CM | POA: Diagnosis present

## 2023-07-27 DIAGNOSIS — R112 Nausea with vomiting, unspecified: Secondary | ICD-10-CM

## 2023-07-27 DIAGNOSIS — G2581 Restless legs syndrome: Secondary | ICD-10-CM | POA: Diagnosis present

## 2023-07-27 DIAGNOSIS — Z9861 Coronary angioplasty status: Secondary | ICD-10-CM

## 2023-07-27 DIAGNOSIS — K56609 Unspecified intestinal obstruction, unspecified as to partial versus complete obstruction: Secondary | ICD-10-CM

## 2023-07-27 DIAGNOSIS — E861 Hypovolemia: Secondary | ICD-10-CM | POA: Diagnosis present

## 2023-07-27 DIAGNOSIS — I21A1 Myocardial infarction type 2: Secondary | ICD-10-CM | POA: Diagnosis present

## 2023-07-27 DIAGNOSIS — Z951 Presence of aortocoronary bypass graft: Secondary | ICD-10-CM

## 2023-07-27 LAB — URINALYSIS, W/ REFLEX TO CULTURE (INFECTION SUSPECTED)
Bilirubin Urine: NEGATIVE
Glucose, UA: NEGATIVE mg/dL
Hgb urine dipstick: NEGATIVE
Ketones, ur: NEGATIVE mg/dL
Leukocytes,Ua: NEGATIVE
Nitrite: NEGATIVE
Protein, ur: NEGATIVE mg/dL
Specific Gravity, Urine: 1.017 (ref 1.005–1.030)
pH: 5 (ref 5.0–8.0)

## 2023-07-27 LAB — CBC WITH DIFFERENTIAL/PLATELET
Abs Immature Granulocytes: 0.03 10*3/uL (ref 0.00–0.07)
Basophils Absolute: 0 10*3/uL (ref 0.0–0.1)
Basophils Relative: 0 %
Eosinophils Absolute: 0.1 10*3/uL (ref 0.0–0.5)
Eosinophils Relative: 1 %
HCT: 44.2 % (ref 39.0–52.0)
Hemoglobin: 15 g/dL (ref 13.0–17.0)
Immature Granulocytes: 0 %
Lymphocytes Relative: 5 %
Lymphs Abs: 0.7 10*3/uL (ref 0.7–4.0)
MCH: 30 pg (ref 26.0–34.0)
MCHC: 33.9 g/dL (ref 30.0–36.0)
MCV: 88.4 fL (ref 80.0–100.0)
Monocytes Absolute: 1.3 10*3/uL — ABNORMAL HIGH (ref 0.1–1.0)
Monocytes Relative: 9 %
Neutro Abs: 12.2 10*3/uL — ABNORMAL HIGH (ref 1.7–7.7)
Neutrophils Relative %: 85 %
Platelets: 216 10*3/uL (ref 150–400)
RBC: 5 MIL/uL (ref 4.22–5.81)
RDW: 12.7 % (ref 11.5–15.5)
WBC: 14.3 10*3/uL — ABNORMAL HIGH (ref 4.0–10.5)
nRBC: 0 % (ref 0.0–0.2)

## 2023-07-27 LAB — COMPREHENSIVE METABOLIC PANEL WITH GFR
ALT: 17 U/L (ref 0–44)
AST: 22 U/L (ref 15–41)
Albumin: 3.6 g/dL (ref 3.5–5.0)
Alkaline Phosphatase: 56 U/L (ref 38–126)
Anion gap: 9 (ref 5–15)
BUN: 54 mg/dL — ABNORMAL HIGH (ref 8–23)
CO2: 22 mmol/L (ref 22–32)
Calcium: 8.9 mg/dL (ref 8.9–10.3)
Chloride: 94 mmol/L — ABNORMAL LOW (ref 98–111)
Creatinine, Ser: 2.06 mg/dL — ABNORMAL HIGH (ref 0.61–1.24)
GFR, Estimated: 31 mL/min — ABNORMAL LOW (ref >60)
Glucose, Bld: 159 mg/dL — ABNORMAL HIGH (ref 70–99)
Potassium: 5.4 mmol/L — ABNORMAL HIGH (ref 3.5–5.1)
Sodium: 125 mmol/L — ABNORMAL LOW (ref 135–145)
Total Bilirubin: 1.1 mg/dL (ref 0.0–1.2)
Total Protein: 6.9 g/dL (ref 6.5–8.1)

## 2023-07-27 LAB — SODIUM
Sodium: 135 mmol/L (ref 135–145)
Sodium: 135 mmol/L (ref 135–145)

## 2023-07-27 LAB — LACTIC ACID, PLASMA
Lactic Acid, Venous: 2.1 mmol/L (ref 0.5–1.9)
Lactic Acid, Venous: 1.3 mmol/L (ref 0.5–1.9)

## 2023-07-27 LAB — APTT: aPTT: 33 s (ref 24–36)

## 2023-07-27 LAB — TROPONIN I (HIGH SENSITIVITY)
Troponin I (High Sensitivity): 125 ng/L (ref <18)
Troponin I (High Sensitivity): 202 ng/L (ref <18)
Troponin I (High Sensitivity): 182 ng/L (ref <18)

## 2023-07-27 LAB — PROTIME-INR
INR: 1 (ref 0.8–1.2)
Prothrombin Time: 13.7 s (ref 11.4–15.2)

## 2023-07-27 MED ORDER — SODIUM CHLORIDE 0.9 % IV BOLUS
1000.0000 mL | Freq: Once | INTRAVENOUS | Status: AC
  Administered 2023-07-27: 1000 mL via INTRAVENOUS

## 2023-07-27 MED ORDER — ROPINIROLE HCL 1 MG PO TABS
1.0000 mg | ORAL_TABLET | Freq: Every day | ORAL | Status: DC
  Administered 2023-07-28: 1 mg via ORAL
  Filled 2023-07-27: qty 1

## 2023-07-27 MED ORDER — IOHEXOL 9 MG/ML PO SOLN
500.0000 mL | ORAL | Status: AC
  Administered 2023-07-27: 500 mL via ORAL

## 2023-07-27 MED ORDER — PERFLUTREN LIPID MICROSPHERE
1.0000 mL | INTRAVENOUS | Status: AC | PRN
  Administered 2023-07-27: 2 mL via INTRAVENOUS

## 2023-07-27 MED ORDER — HEPARIN (PORCINE) 25000 UT/250ML-% IV SOLN
1400.0000 [IU]/h | INTRAVENOUS | Status: DC
  Administered 2023-07-27 – 2023-07-29 (×3): 1400 [IU]/h via INTRAVENOUS
  Filled 2023-07-27 (×3): qty 250

## 2023-07-27 MED ORDER — GABAPENTIN 300 MG PO CAPS
300.0000 mg | ORAL_CAPSULE | Freq: Every day | ORAL | Status: DC
  Administered 2023-07-27 – 2023-07-28 (×2): 300 mg via ORAL
  Filled 2023-07-27 (×2): qty 1

## 2023-07-27 MED ORDER — ONDANSETRON HCL 4 MG PO TABS: 4.0000 mg | ORAL_TABLET | Freq: Four times a day (QID) | ORAL | Status: DC | PRN

## 2023-07-27 MED ORDER — ONDANSETRON HCL 4 MG/2ML IJ SOLN: 4.0000 mg | Freq: Four times a day (QID) | INTRAMUSCULAR | Status: DC | PRN

## 2023-07-27 MED ORDER — ONDANSETRON HCL 4 MG/2ML IJ SOLN
4.0000 mg | Freq: Once | INTRAMUSCULAR | Status: AC
  Administered 2023-07-27: 4 mg via INTRAVENOUS
  Filled 2023-07-27: qty 2

## 2023-07-27 MED ORDER — SODIUM CHLORIDE 0.9 % IV SOLN
12.5000 mg | Freq: Four times a day (QID) | INTRAVENOUS | Status: DC | PRN
  Administered 2023-07-27 – 2023-07-28 (×2): 12.5 mg via INTRAVENOUS
  Filled 2023-07-27: qty 0.5
  Filled 2023-07-27 (×2): qty 12.5

## 2023-07-27 MED ORDER — ACETAMINOPHEN 325 MG PO TABS: 650.0000 mg | ORAL_TABLET | Freq: Four times a day (QID) | ORAL | Status: DC | PRN

## 2023-07-27 MED ORDER — PANTOPRAZOLE SODIUM 40 MG IV SOLR
40.0000 mg | Freq: Two times a day (BID) | INTRAVENOUS | Status: DC
  Administered 2023-07-27 – 2023-07-29 (×4): 40 mg via INTRAVENOUS
  Filled 2023-07-27 (×4): qty 10

## 2023-07-27 MED ORDER — ATORVASTATIN CALCIUM 20 MG PO TABS
40.0000 mg | ORAL_TABLET | Freq: Every day | ORAL | Status: DC
  Administered 2023-07-28 – 2023-07-29 (×2): 40 mg via ORAL
  Filled 2023-07-27 (×2): qty 2

## 2023-07-27 MED ORDER — HEPARIN BOLUS VIA INFUSION
4000.0000 [IU] | Freq: Once | INTRAVENOUS | Status: AC
  Administered 2023-07-27: 4000 [IU] via INTRAVENOUS
  Filled 2023-07-27: qty 4000

## 2023-07-27 NOTE — Consult Note (Signed)
 PHARMACY - ANTICOAGULATION CONSULT NOTE  Pharmacy Consult for Heparin  Indication: chest pain/ACS  No Known Allergies  Patient Measurements: Height: 5\' 11"  (180.3 cm) Weight: 109 kg (240 lb 4.8 oz) IBW/kg (Calculated) : 75.3 HEPARIN  DW (KG): 98.6  Vital Signs: Temp: 97.5 F (36.4 C) (05/09 1111) Temp Source: Oral (05/09 1111) BP: 123/55 (05/09 1430) Pulse Rate: 80 (05/09 1430)  Labs: Recent Labs    07/27/23 1119  HGB 15.0  HCT 44.2  PLT 216  CREATININE 2.06*  TROPONINIHS 125*    Estimated Creatinine Clearance: 32.3 mL/min (A) (by C-G formula based on SCr of 2.06 mg/dL (H)).   Medical History: Past Medical History:  Diagnosis Date   BPH (benign prostatic hyperplasia)    CAD (coronary artery disease)    Carotid stenosis    Hyperlipidemia    Hypertension    PAD (peripheral artery disease) (HCC)    RLS (restless legs syndrome)     Medications:  No history of chronic anticoagulation use PTA.  Assessment: 89 y.o. male with a past medical history of CAD, hypertension, hyperlipidemia, presents to the emergency department for generalized weakness dizziness and continued left lower back pain. Troponin level of 125. Pharmacy has been consulted to dose continuous heparin  infusion.  Baseline labs: Hgb 15.0, Plts 216, aPTT 33 sec, INR 1.0 Goal of Therapy:  Heparin  level 0.3-0.7 units/ml Monitor platelets by anticoagulation protocol: Yes   Plan:  Give 4000 units bolus x 1 Start heparin  infusion at 1400 units/hr Check anti-Xa level in 8 hours and daily while on heparin  Continue to monitor H&H and platelets  Laron Boorman A Marci Polito 07/27/2023,3:42 PM

## 2023-07-27 NOTE — Assessment & Plan Note (Signed)
 On admission.  BP stable. Titrate home regimen  07-28-2023 given his volume depletion, AKI. Hold ACEI. Ok for lopressor  due to NSTEMI.  07-29-2023 on lopressor  12.5 mg bid. Continue to hold ACEI due to AKI.

## 2023-07-27 NOTE — Assessment & Plan Note (Signed)
 On admission. Baseline coronary disease followed by Dr. Parks Bollman outpatient Troponin 120s on presentation with noted recurrent nausea EKG stable Started on a heparin  drip in the ER-continue Trend troponin 2D echo Follow cardiology recommendations  07-28-2023 likely demand ischemia due to hypotension/hypovolemia. Cards wants to continue IV heparin  for a total of 48 hours. Echo with hyperdynamic LVEF. 70%  07-29-2023 defer to cardiology. Hopefully IV heparin  can stop today.

## 2023-07-27 NOTE — Assessment & Plan Note (Addendum)
 On admission. Creatinine 2 on presentation Clinically dry IV fluid hydration Hold offending agents Monitor  07-28-2023 Scr 1.67. on admission, Scr 2.06. at baseline scr 1.2.  continue with IVF. Monitor Scr  07-29-2023 Scr 1.53 today. Baseline 1.2. will stop IVF today and allow pt to hydrate by himself.  *update. Pt wants to go home. Will hold ACEI at discharge. Will need repeat BMP in PCP office next week.

## 2023-07-27 NOTE — Assessment & Plan Note (Signed)
 Sodium 125 on presentation Clinically dry in setting of recurrent nausea vomiting IV fluid hydration Trend sodium Urine and serum studies Goal 6-8 mill equivalent change over the next 12 to 24 hours

## 2023-07-27 NOTE — H&P (Addendum)
 History and Physical    Patient: Joel Mata:[id number] DOB: 1934-11-06 DOA: 07/27/2023 DOS: the patient was seen and examined on 07/27/2023 PCP: Antonio Baumgarten, MD  Patient coming from: Home  Chief Complaint:  Chief Complaint  Patient presents with   Hypotension   HPI: [name] is a 89 y.o. male with medical history significant of coronary artery disease, hyperlipidemia, hypertension, peripheral vascular disease, restless leg presenting with hyponatremia, AKI, NSTEMI.  Patient noted to have been seen last week for hip bursitis.  Received a steroid shot.  Per report, had improvement in symptoms though worsening back pain over the past 4 to 5 days.  Was seen by orthopedic surgery with recommendations of symptoms likely coming from radicular lumbar pain.  Per the wife, patient had recurring episodes of nausea and vomiting as well as decreased p.o. intake over the past 3 to 4 days.  No fevers or chills.  No reported recent sick contacts.  No chest pain or shortness of breath.  Nausea has been nonbloody and nonbilious.  No bowel or bladder anesthesia.  No reported urinary incontinence. Presented to the ER afebrile, initial systolic pressures in the 70s-improved to the 120s with fluid bolus.  Satting well on room air.  White count 14.3, hemoglobin 15, platelets 216, creatinine 2.06, glucose 159, sodium 125. Troponin 125. EKG NSR.  Review of Systems: As mentioned in the history of present illness. All other systems reviewed and are negative. Past Medical History:  Diagnosis Date   BPH (benign prostatic hyperplasia)    CAD (coronary artery disease)    Carotid stenosis    Hyperlipidemia    Hypertension    PAD (peripheral artery disease) (HCC)    RLS (restless legs syndrome)    Past Surgical History:  Procedure Laterality Date   APPENDECTOMY     CARDIAC CATHETERIZATION N/A 07/20/2014   Procedure: Left Heart Cath and Cors/Grafts Angiography;  Surgeon: Percival Brace, MD;   Location: [facility] INVASIVE CV LAB;  Service: Cardiovascular;  Laterality: N/A;   CATARACT EXTRACTION Bilateral    CORONARY ARTERY BYPASS GRAFT  2009   ENDOSCOPIC CARPAL TUNNEL RELEASE Left 2005   Left CEA  2006   Social History:  reports that he has never smoked. He has never used smokeless tobacco. No history on file for alcohol use and drug use.  No Known Allergies  History reviewed. No pertinent family history.  Prior to Admission medications   Medication Sig Start Date End Date Taking? Authorizing Provider  Acetaminophen  (TYLENOL  PO) Take by mouth as needed.    [provider]  aspirin  EC 81 MG tablet Take 81 mg by mouth daily. Swallow whole.    [provider]  atorvastatin  (LIPITOR) 40 MG tablet Take 40 mg by mouth daily. 10/27/21   [provider]  beta carotene w/minerals (OCUVITE) tablet Take 1 tablet by mouth daily with breakfast.    [provider]  clopidogrel  (PLAVIX ) 75 MG tablet Take 1 tablet (75 mg total) by mouth daily. 07/20/14   Antonio Baumgarten, MD  dutasteride  (AVODART ) 0.5 MG capsule Take 0.5 mg by mouth daily.    [provider]  ferrous sulfate 325 (65 FE) MG tablet Take 325 mg by mouth 3 (three) times a week.    [provider]  finasteride (PROSCAR) 5 MG tablet Take 1 tablet by mouth 3 (three) times a week. 05/16/21   [provider]  gabapentin (NEURONTIN) 100 MG capsule Take 300 mg by mouth at bedtime. 11/26/21  [provider]  isosorbide  mononitrate (IMDUR ) 30 MG 24 hr tablet Take 1 tablet (30 mg total) by mouth daily. 07/20/14   Antonio Baumgarten, MD  lisinopril  (PRINIVIL ,ZESTRIL ) 30 MG tablet Take 30 mg by mouth daily.    [provider]  metoprolol  tartrate (LOPRESSOR ) 25 MG tablet Take 1 tablet (25 mg total) by mouth 2 (two) times daily. 07/20/14   Hande, Vishwanath, MD  omeprazole (PRILOSEC) 40 MG capsule Take 40 mg by mouth daily as needed. 11/16/21   [provider]   rOPINIRole  (REQUIP ) 1 MG tablet Take 1 mg by mouth at bedtime.    [provider]  tiZANidine (ZANAFLEX) 4 MG tablet Take 4 mg by mouth 3 (three) times daily. 12/04/21   [provider]    Physical Exam: Vitals:   07/27/23 1300 07/27/23 1430 07/27/23 1449 07/27/23 1611  BP: (!) 98/56 (!) 123/55  128/67  Pulse: 70 80  91  Resp: 19 19  18   Temp:    97.8 F (36.6 C)  TempSrc:      SpO2: 94% 95%  96%  Weight:   109 kg   Height:   5\' 11"  (1.803 m)    Physical Exam Constitutional:      Appearance: He is normal weight.  HENT:     Head: Normocephalic and atraumatic.     Nose: Nose normal.     Mouth/Throat:     Mouth: Mucous membranes are moist.  Eyes:     Pupils: Pupils are equal, round, and reactive to light.  Cardiovascular:     Rate and Rhythm: Normal rate and regular rhythm.  Pulmonary:     Effort: Pulmonary effort is normal.  Abdominal:     General: Bowel sounds are normal.  Musculoskeletal:        General: Normal range of motion.  Neurological:     General: No focal deficit present.  Psychiatric:        Mood and Affect: Mood normal.     Data Reviewed:  There are no new results to review at this time.   Lab Results  Component Value Date   WBC 14.3 (H) 07/27/2023   HGB 15.0 07/27/2023   HCT 44.2 07/27/2023   MCV 88.4 07/27/2023   PLT 216 07/27/2023   Last metabolic panel Lab Results  Component Value Date   GLUCOSE 159 (H) 07/27/2023   NA 125 (L) 07/27/2023   K 5.4 (H) 07/27/2023   CL 94 (L) 07/27/2023   CO2 22 07/27/2023   BUN 54 (H) 07/27/2023   CREATININE 2.06 (H) 07/27/2023   GFRNONAA 31 (L) 07/27/2023   CALCIUM  8.9 07/27/2023   PROT 6.9 07/27/2023   ALBUMIN 3.6 07/27/2023   BILITOT 1.1 07/27/2023   ALKPHOS 56 07/27/2023   AST 22 07/27/2023   ALT 17 07/27/2023   ANIONGAP 9 07/27/2023    Assessment and Plan: * Hyponatremia Sodium 125 on presentation Clinically dry in setting of recurrent nausea vomiting IV fluid  hydration Trend sodium Urine and serum studies Goal 6-8 mill equivalent change over the next 12 to 24 hours  NSTEMI (non-ST elevated myocardial infarction) (HCC) Baseline coronary disease followed by Dr. Parks Bollman outpatient Troponin 120s on presentation with noted recurrent nausea EKG stable Started on a heparin  drip in the ER-continue Trend troponin 2D echo Follow cardiology recommendations   AKI (acute kidney injury) (HCC) Creatinine 2 on presentation Clinically dry IV fluid hydration Hold offending agents Monitor  Nausea and vomiting Noted recurrent nausea and vomiting  x 24  Non bloody-non bilious emesis  CT A&P pending to better assess  Noted recent steroid use  IV PPI  Noted secondary NSTEMI is a confounding issue  Monitor    Hyperlipidemia Continue statin  Hypertension BP stable Titrate home regimen  Lumbar back pain Noted lumbar back pain followed by outpatient orthopedic surgery No bowel or bladder incontinence Will check lumbar plain films x 1 Pain control Monitor      Advance Care Planning:   Code Status: Full Code   Consults: Cardiology   Family Communication: Wife at the bedside   Severity of Illness: The appropriate patient status for this patient is OBSERVATION. Observation status is judged to be reasonable and necessary in order to provide the required intensity of service to ensure the patient's safety. The patient's presenting symptoms, physical exam findings, and initial radiographic and laboratory data in the context of their medical condition is felt to place them at decreased risk for further clinical deterioration. Furthermore, it is anticipated that the patient will be medically stable for discharge from the hospital within 2 midnights of admission.   Author: Corrinne Din, MD 07/27/2023 6:07 PM  For on call review [email or web address].

## 2023-07-27 NOTE — Progress Notes (Signed)
 Echocardiogram 2D Echocardiogram has been performed.  Joel Mata 07/27/2023, 7:39 PM

## 2023-07-27 NOTE — Assessment & Plan Note (Signed)
 On admission. Noted recurrent nausea and vomiting x 24  Non bloody-non bilious emesis  CT A&P pending to better assess  Noted recent steroid use  IV PPI  Noted secondary NSTEMI is a confounding issue  Monitor   07-28-2023 CT abd shows possible early/developing SBO. Pt without any further vomiting this AM. Pt is passing flatus. He wants something to eat/drink. Will allow for clear liquids. Give po gastrograffin and check abd xr in 8 hours. Hopefully contrast reaches his rectum and SBO can be ruled out   07-29-2023 gastrograffin contrast within descending colon/sigmoid colon. No obstruction. Change to solid food.  *update. Pt has eaten solid food for breakfast and lunch without difficulty. Had large BM this afternoon. Ready for DC. Wife agreeable to taking him home this evening

## 2023-07-27 NOTE — Assessment & Plan Note (Signed)
 On admission. Noted lumbar back pain followed by outpatient orthopedic surgery No bowel or bladder incontinence Will check lumbar plain films x 1 Pain control Monitor  07-28-2023 wife states pt able to roll back and forth in bed last night without any difficulty. He has not been able to do that is several weeks due to back pain. PT consult.  07-29-2023 pt no longer having acute back pain. Has chronic back pain.

## 2023-07-27 NOTE — ED Triage Notes (Addendum)
 Pt was brought over from [facility] due to lower back pain and hypotension and nausea.  Pt has been having lower back pain as well as nausea and generalized weakness since last Friday.  Pt BP 77/51 in triage. Pt states that he has been feeling increasingly unwell and weak and having severe pain in lower back since after injection in his back Friday.  Pt also has been having blurred vision with this

## 2023-07-27 NOTE — ED Provider Notes (Signed)
 [facility] Provider Note    Event Date/Time   First MD Initiated Contact with Patient 07/27/23 1119     (approximate)  History   Chief Complaint: Hypotension  HPI  Joel Mata is a 89 y.o. male with a past medical history of CAD, hypertension, hyperlipidemia, presents to the emergency department for generalized weakness dizziness and continued left lower back pain.  According to the wife little over a week ago patient received a right hip injection for bursitis.  States since that time the pain in the hip has calm down somewhat but he has began experiencing more significant pain in the right lower back (SI joint area).  Patient denies any anterior abdominal pain.  Wife states the patient ate very little yesterday did not eat dinner and did not eat anything today.  Also states he only drank 1 bottle of water yesterday as his only fluids.  Today he was feeling very dizzy and lightheaded, was able to see Dr. Clyda Dark of orthopedics however was sent to the emergency department for further evaluation.  Patient found to be hypotensive upon arrival 77/51.  Patient did take his blood pressure medications last night and this morning.  Physical Exam   Triage Vital Signs: ED Triage Vitals  Encounter Vitals Group     BP 07/27/23 1111 (!) 77/51     Systolic BP Percentile --      Diastolic BP Percentile --      Pulse Rate 07/27/23 1111 85     Resp 07/27/23 1111 20     Temp 07/27/23 1111 (!) 97.5 F (36.4 C)     Temp Source 07/27/23 1111 Oral     SpO2 07/27/23 1111 93 %     Weight --      Height --      Head Circumference --      Peak Flow --      Pain Score 07/27/23 1112 10     Pain Loc --      Pain Education --      Exclude from Growth Chart --     Most recent vital signs: Vitals:   07/27/23 1111  BP: (!) 77/51  Pulse: 85  Resp: 20  Temp: (!) 97.5 F (36.4 C)  SpO2: 93%    General: Awake, no distress.  Answering questions appropriately.  Dry appearing  mucous membranes. CV:  Good peripheral perfusion.  Regular rate and rhythm  Resp:  Normal effort.  Equal breath sounds bilaterally.  Abd:  No distention.  Soft, nontender.  No rebound or guarding. Other:  Mild tenderness over the right SI joint otherwise benign back.  No right hip tenderness to palpation.   ED Results / Procedures / Treatments   EKG  EKG viewed and interpreted by myself shows a sinus rhythm at 73 bpm with a narrow QRS, normal axis, normal intervals, nonspecific but no concerning ST changes.   MEDICATIONS ORDERED IN ED: Medications  sodium chloride  0.9 % bolus 1,000 mL (has no administration in time range)     IMPRESSION / MDM / ASSESSMENT AND PLAN / ED COURSE  I reviewed the triage vital signs and the nursing notes.  Patient's presentation is most consistent with acute presentation with potential threat to life or bodily function.  Patient presents to the emergency department for right lower back pain weakness nausea dizziness.  Patient found to be hypotensive 77/51 on arrival here patient has recently undergone 2 rounds of steroids currently taking steroids  for his back pain.  Patient remains hypotensive 84 systolic on my check.  Will start IV fluids.  Will check labs including a CBC chemistry and troponin.  Will obtain an EKG.  Will continue to closely monitor in the emergency department.  Differential is quite broad but would include metabolic abnormality such as renal dysfunction, medication reaction, dehydration, infectious etiologies.  Patient's labs have resulted showing acute kidney injury with a creatinine of 2.06 down from a baseline of 1.2 recently.  Patient's CBC shows a slight leukocytosis.  Lactic acid is normal.  Troponin is elevated to 125 there is no troponin in our system or Care Everywhere for comparison.  Given the patient's hypotension with elevated troponin we will start the patient on a heparin  infusion.  Patient has received 2 L of fluids and blood  pressure is now improved currently 123/55.  Patient mated to the hospitalist service for further workup and treatment.  CRITICAL CARE Performed by: Ruth Cove   Total critical care time: 30 minutes  Critical care time was exclusive of separately billable procedures and treating other patients.  Critical care was necessary to treat or prevent imminent or life-threatening deterioration.  Critical care was time spent personally by me on the following activities: development of treatment plan with patient and/or surrogate as well as nursing, discussions with consultants, evaluation of patient's response to treatment, examination of patient, obtaining history from patient or surrogate, ordering and performing treatments and interventions, ordering and review of laboratory studies, ordering and review of radiographic studies, pulse oximetry and re-evaluation of patient's condition.   FINAL CLINICAL IMPRESSION(S) / ED DIAGNOSES   Back pain Hypotension Weakness Elevated troponin  Note:  This document was prepared using Dragon voice recognition software and may include unintentional dictation errors.   Ruth Cove, MD 07/27/23 [phone]

## 2023-07-27 NOTE — Assessment & Plan Note (Signed)
 Continue statin

## 2023-07-28 ENCOUNTER — Inpatient Hospital Stay

## 2023-07-28 DIAGNOSIS — I21A1 Myocardial infarction type 2: Secondary | ICD-10-CM | POA: Diagnosis present

## 2023-07-28 DIAGNOSIS — E66811 Obesity, class 1: Secondary | ICD-10-CM | POA: Diagnosis present

## 2023-07-28 DIAGNOSIS — I739 Peripheral vascular disease, unspecified: Secondary | ICD-10-CM | POA: Diagnosis present

## 2023-07-28 DIAGNOSIS — Z951 Presence of aortocoronary bypass graft: Secondary | ICD-10-CM | POA: Diagnosis not present

## 2023-07-28 DIAGNOSIS — Z7982 Long term (current) use of aspirin: Secondary | ICD-10-CM | POA: Diagnosis not present

## 2023-07-28 DIAGNOSIS — G8929 Other chronic pain: Secondary | ICD-10-CM | POA: Diagnosis present

## 2023-07-28 DIAGNOSIS — N400 Enlarged prostate without lower urinary tract symptoms: Secondary | ICD-10-CM | POA: Diagnosis present

## 2023-07-28 DIAGNOSIS — Z9861 Coronary angioplasty status: Secondary | ICD-10-CM | POA: Diagnosis not present

## 2023-07-28 DIAGNOSIS — E860 Dehydration: Secondary | ICD-10-CM | POA: Diagnosis present

## 2023-07-28 DIAGNOSIS — E871 Hypo-osmolality and hyponatremia: Secondary | ICD-10-CM | POA: Diagnosis present

## 2023-07-28 DIAGNOSIS — M545 Low back pain, unspecified: Secondary | ICD-10-CM | POA: Diagnosis present

## 2023-07-28 DIAGNOSIS — E785 Hyperlipidemia, unspecified: Secondary | ICD-10-CM | POA: Diagnosis present

## 2023-07-28 DIAGNOSIS — Z79899 Other long term (current) drug therapy: Secondary | ICD-10-CM | POA: Diagnosis not present

## 2023-07-28 DIAGNOSIS — N179 Acute kidney failure, unspecified: Secondary | ICD-10-CM | POA: Diagnosis present

## 2023-07-28 DIAGNOSIS — Z6833 Body mass index (BMI) 33.0-33.9, adult: Secondary | ICD-10-CM | POA: Diagnosis not present

## 2023-07-28 DIAGNOSIS — R112 Nausea with vomiting, unspecified: Secondary | ICD-10-CM | POA: Diagnosis present

## 2023-07-28 DIAGNOSIS — E861 Hypovolemia: Secondary | ICD-10-CM | POA: Diagnosis present

## 2023-07-28 DIAGNOSIS — I214 Non-ST elevation (NSTEMI) myocardial infarction: Secondary | ICD-10-CM | POA: Diagnosis not present

## 2023-07-28 DIAGNOSIS — I2510 Atherosclerotic heart disease of native coronary artery without angina pectoris: Secondary | ICD-10-CM | POA: Diagnosis present

## 2023-07-28 DIAGNOSIS — R935 Abnormal findings on diagnostic imaging of other abdominal regions, including retroperitoneum: Secondary | ICD-10-CM | POA: Diagnosis present

## 2023-07-28 DIAGNOSIS — G2581 Restless legs syndrome: Secondary | ICD-10-CM | POA: Diagnosis present

## 2023-07-28 DIAGNOSIS — I959 Hypotension, unspecified: Secondary | ICD-10-CM | POA: Diagnosis present

## 2023-07-28 DIAGNOSIS — I10 Essential (primary) hypertension: Secondary | ICD-10-CM | POA: Diagnosis present

## 2023-07-28 DIAGNOSIS — Z7902 Long term (current) use of antithrombotics/antiplatelets: Secondary | ICD-10-CM | POA: Diagnosis not present

## 2023-07-28 LAB — CBC
HCT: 42.7 % (ref 39.0–52.0)
Hemoglobin: 14 g/dL (ref 13.0–17.0)
MCH: 29.7 pg (ref 26.0–34.0)
MCHC: 32.8 g/dL (ref 30.0–36.0)
MCV: 90.7 fL (ref 80.0–100.0)
Platelets: 153 10*3/uL (ref 150–400)
RBC: 4.71 MIL/uL (ref 4.22–5.81)
RDW: 12.7 % (ref 11.5–15.5)
WBC: 8.4 10*3/uL (ref 4.0–10.5)
nRBC: 0 % (ref 0.0–0.2)

## 2023-07-28 LAB — CORTISOL: Cortisol, Plasma: 18.2 ug/dL

## 2023-07-28 LAB — COMPREHENSIVE METABOLIC PANEL WITH GFR
ALT: 15 U/L (ref 0–44)
AST: 17 U/L (ref 15–41)
Albumin: 3.2 g/dL — ABNORMAL LOW (ref 3.5–5.0)
Alkaline Phosphatase: 48 U/L (ref 38–126)
Anion gap: 8 (ref 5–15)
BUN: 42 mg/dL — ABNORMAL HIGH (ref 8–23)
CO2: 23 mmol/L (ref 22–32)
Calcium: 8.7 mg/dL — ABNORMAL LOW (ref 8.9–10.3)
Chloride: 104 mmol/L (ref 98–111)
Creatinine, Ser: 1.67 mg/dL — ABNORMAL HIGH (ref 0.61–1.24)
GFR, Estimated: 40 mL/min — ABNORMAL LOW (ref >60)
Glucose, Bld: 122 mg/dL — ABNORMAL HIGH (ref 70–99)
Potassium: 4.7 mmol/L (ref 3.5–5.1)
Sodium: 135 mmol/L (ref 135–145)
Total Bilirubin: 1.2 mg/dL (ref 0.0–1.2)
Total Protein: 6.2 g/dL — ABNORMAL LOW (ref 6.5–8.1)

## 2023-07-28 LAB — OSMOLALITY: Osmolality: 300 mosm/kg — ABNORMAL HIGH (ref 275–295)

## 2023-07-28 LAB — ECHOCARDIOGRAM COMPLETE
AR max vel: 2.35 cm2
AV Area VTI: 2.31 cm2
AV Area mean vel: 2.27 cm2
AV Mean grad: 7 mmHg
AV Peak grad: 14.3 mmHg
Ao pk vel: 1.89 m/s
Area-P 1/2: 7.37 cm2
Height: 71 in
S' Lateral: 2.65 cm
Weight: 3844.82 [oz_av]

## 2023-07-28 LAB — HEPARIN LEVEL (UNFRACTIONATED)
Heparin Unfractionated: 0.5 [IU]/mL (ref 0.30–0.70)
Heparin Unfractionated: 0.52 [IU]/mL (ref 0.30–0.70)

## 2023-07-28 LAB — LIPID PANEL
Cholesterol: 130 mg/dL (ref 0–200)
HDL: 50 mg/dL (ref >40)
LDL Cholesterol: 62 mg/dL (ref 0–99)
Total CHOL/HDL Ratio: 2.6 ratio
Triglycerides: 92 mg/dL (ref <150)
VLDL: 18 mg/dL (ref 0–40)

## 2023-07-28 LAB — OSMOLALITY, URINE: Osmolality, Ur: 508 mosm/kg (ref 300–900)

## 2023-07-28 LAB — SODIUM: Sodium: 134 mmol/L — ABNORMAL LOW (ref 135–145)

## 2023-07-28 MED ORDER — LACTATED RINGERS IV SOLN
INTRAVENOUS | Status: DC
Start: 2023-07-28 — End: 2023-07-29

## 2023-07-28 MED ORDER — METOPROLOL TARTRATE 25 MG PO TABS
12.5000 mg | ORAL_TABLET | Freq: Two times a day (BID) | ORAL | Status: DC
  Administered 2023-07-28 – 2023-07-29 (×3): 12.5 mg via ORAL
  Filled 2023-07-28 (×3): qty 1

## 2023-07-28 MED ORDER — DUTASTERIDE 0.5 MG PO CAPS
0.5000 mg | ORAL_CAPSULE | Freq: Every day | ORAL | Status: DC
Start: 2023-07-28 — End: 2023-07-30
  Administered 2023-07-28 – 2023-07-29 (×2): 0.5 mg via ORAL
  Filled 2023-07-28 (×2): qty 1

## 2023-07-28 MED ORDER — ACETAMINOPHEN 500 MG PO TABS: 1000.0000 mg | ORAL_TABLET | Freq: Four times a day (QID) | ORAL | Status: DC | PRN

## 2023-07-28 MED ORDER — DIATRIZOATE MEGLUMINE & SODIUM 66-10 % PO SOLN
90.0000 mL | Freq: Once | ORAL | Status: AC
  Administered 2023-07-28: 90 mL via ORAL

## 2023-07-28 MED ORDER — ASPIRIN 81 MG PO TBEC
81.0000 mg | DELAYED_RELEASE_TABLET | Freq: Every day | ORAL | Status: DC
  Administered 2023-07-29: 81 mg via ORAL
  Filled 2023-07-28: qty 1

## 2023-07-28 NOTE — Assessment & Plan Note (Signed)
 07-28-2023 CT abd shows possible early/developing SBO. Pt without any further vomiting this AM. Pt is passing flatus. He wants something to eat/drink. Will allow for clear liquids. Give po gastrograffin and check abd xr in 8 hours. Hopefully contrast reaches his rectum and SBO can be ruled out.  07-29-2023 gastrograffin contrast within descending colon/sigmoid colon. No obstruction. Change to solid food.   *update. Pt has eaten solid food for breakfast and lunch without difficulty. Had large BM this afternoon. Ready for DC. Wife agreeable to taking him home this evening

## 2023-07-28 NOTE — Care Management Obs Status (Signed)
 MEDICARE OBSERVATION STATUS NOTIFICATION   Patient Details  Name: Joel Mata MRN: [id number] Date of Birth: 24-Jul-1934   Medicare Observation Status Notification Given:  Rudolph Cost, CMA 07/28/2023, 11:55 AM

## 2023-07-28 NOTE — Hospital Course (Addendum)
 HPI: Joel Mata is a 89 y.o. male with medical history significant of coronary artery disease, hyperlipidemia, hypertension, peripheral vascular disease, restless leg presenting with hyponatremia, AKI, NSTEMI.  Patient noted to have been seen last week for hip bursitis.  Received a steroid shot.  Per report, had improvement in symptoms though worsening back pain over the past 4 to 5 days.  Was seen by orthopedic surgery with recommendations of symptoms likely coming from radicular lumbar pain.  Per the wife, patient had recurring episodes of nausea and vomiting as well as decreased p.o. intake over the past 3 to 4 days.  No fevers or chills.  No reported recent sick contacts.  No chest pain or shortness of breath.  Nausea has been nonbloody and nonbilious.  No bowel or bladder anesthesia.  No reported urinary incontinence. Presented to the ER afebrile, initial systolic pressures in the 70s-improved to the 120s with fluid bolus.  Satting well on room air.  White count 14.3, hemoglobin 15, platelets 216, creatinine 2.06, glucose 159, sodium 125. Troponin 125. EKG NSR.  Review of Systems: As mentioned in the history of present illness. All other systems reviewed and are negative.  Significant Events: Admitted 07/27/2023 for hyponatremia   Significant Labs: Na 125, K 5.4, CO2 of 22, BUN 54, Scr 2.06, glu 159 WBC 14.3, HgB 15, plt 216 Troponin I 125->202->182  Significant Imaging Studies: CT abd/pelvis Small hiatal hernia with question distal esophageal wall thickening. Correlate for esophagitis. Underlying mass not excluded. 2. Findings could represent developing early versus partial small bowel obstruction. Consider PO contrast administration and serial abdominal radiographs to evaluate passage of PO contrast to the colon. 3. Colonic diverticulosis with no acute diverticulitis. 4.  Aortic Atherosclerosis (ICD10-I70.0). 5. Partially visualized left anterior thigh soft tissue density measuring at least 5  x 2.5 cm. Recommend further evaluation with grayscale and color Doppler ultrasound. 6. Partially visualized redemonstration of a left hip vastus lateralis intramuscular lipomatous lesion.  Antibiotic Therapy: Anti-infectives (From admission, onward)    None       Procedures:   Consultants: cardiology

## 2023-07-28 NOTE — Progress Notes (Signed)
 PROGRESS NOTE    Joel Mata  UJW:[id number] DOB: 1934-04-06 DOA: 07/27/2023 PCP: Antonio Baumgarten, MD  Subjective: Pt seen and examined. Met with wife and dtr at bedside.  Pt recently on about 10-12 days of po steroids. Also received IM/IV steroid injection due to bursitis.  Has had N/V for about 3-4 days.   Hospital Course: HPI: Joel Mata is a 89 y.o. male with medical history significant of coronary artery disease, hyperlipidemia, hypertension, peripheral vascular disease, restless leg presenting with hyponatremia, AKI, NSTEMI.  Patient noted to have been seen last week for hip bursitis.  Received a steroid shot.  Per report, had improvement in symptoms though worsening back pain over the past 4 to 5 days.  Was seen by orthopedic surgery with recommendations of symptoms likely coming from radicular lumbar pain.  Per the wife, patient had recurring episodes of nausea and vomiting as well as decreased p.o. intake over the past 3 to 4 days.  No fevers or chills.  No reported recent sick contacts.  No chest pain or shortness of breath.  Nausea has been nonbloody and nonbilious.  No bowel or bladder anesthesia.  No reported urinary incontinence. Presented to the ER afebrile, initial systolic pressures in the 70s-improved to the 120s with fluid bolus.  Satting well on room air.  White count 14.3, hemoglobin 15, platelets 216, creatinine 2.06, glucose 159, sodium 125. Troponin 125. EKG NSR.  Review of Systems: As mentioned in the history of present illness. All other systems reviewed and are negative.  Significant Events: Admitted 07/27/2023 for hyponatremia   Significant Labs: Na 125, K 5.4, CO2 of 22, BUN 54, Scr 2.06, glu 159 WBC 14.3, HgB 15, plt 216 Troponin I 125->202->182  Significant Imaging Studies: CT abd/pelvis Small hiatal hernia with question distal esophageal wall thickening. Correlate for esophagitis. Underlying mass not excluded. 2. Findings could represent developing early  versus partial small bowel obstruction. Consider PO contrast administration and serial abdominal radiographs to evaluate passage of PO contrast to the colon. 3. Colonic diverticulosis with no acute diverticulitis. 4.  Aortic Atherosclerosis (ICD10-I70.0). 5. Partially visualized left anterior thigh soft tissue density measuring at least 5 x 2.5 cm. Recommend further evaluation with grayscale and color Doppler ultrasound. 6. Partially visualized redemonstration of a left hip vastus lateralis intramuscular lipomatous lesion.  Antibiotic Therapy: Anti-infectives (From admission, onward)    None       Procedures:   Consultants: cardiology    Assessment and Plan: * Hyponatremia On admission. Sodium 125 on presentation Clinically dry in setting of recurrent nausea vomiting IV fluid hydration Trend sodium Urine and serum studies Goal 6-8 mill equivalent change over the next 12 to 24 hours  07-28-2023 pt with hypovolemic/hyponatremia. Na is normal now that his hypovolemia has been corrected.  NSTEMI (non-ST elevated myocardial infarction) (HCC) On admission. Baseline coronary disease followed by Dr. Parks Bollman outpatient Troponin 120s on presentation with noted recurrent nausea EKG stable Started on a heparin  drip in the ER-continue Trend troponin 2D echo Follow cardiology recommendations  07-28-2023 likely demand ischemia due to hypotension/hypovolemia. Cards wants to continue IV heparin  for a total of 48 hours. Echo with hyperdynamic LVEF. 70%   Abnormal CT of the abdomen 07-28-2023 CT abd shows possible early/developing SBO. Pt without any further vomiting this AM. Pt is passing flatus. He wants something to eat/drink. Will allow for clear liquids. Give po gastrograffin and check abd xr in 8 hours. Hopefully contrast reaches his rectum and SBO can be ruled out.  Nausea and vomiting On admission. Noted recurrent nausea and vomiting x 24  Non bloody-non bilious emesis  CT A&P  pending to better assess  Noted recent steroid use  IV PPI  Noted secondary NSTEMI is a confounding issue  Monitor   07-28-2023 CT abd shows possible early/developing SBO. Pt without any further vomiting this AM. Pt is passing flatus. He wants something to eat/drink. Will allow for clear liquids. Give po gastrograffin and check abd xr in 8 hours. Hopefully contrast reaches his rectum and SBO can be ruled out   Lumbar back pain On admission. Noted lumbar back pain followed by outpatient orthopedic surgery No bowel or bladder incontinence Will check lumbar plain films x 1 Pain control Monitor  07-28-2023 wife states pt able to roll back and forth in bed last night without any difficulty. He has not been able to do that is several weeks due to back pain. PT consult.  AKI (acute kidney injury) (HCC) On admission. Creatinine 2 on presentation Clinically dry IV fluid hydration Hold offending agents Monitor  07-28-2023 Scr 1.67. on admission, Scr 2.06. at baseline scr 1.2.  continue with IVF. Monitor SCr  Obesity, Class I, BMI 30-34.9 Body mass index is 33.52 kg/m.   Hyperlipidemia 07-28-2023 Continue statin  Hypertension On admission.  BP stable. Titrate home regimen  07-28-2023 given his volume depletion, AKI. Hold ACEI. Ok for lopressor  due to NSTEMI.   DVT prophylaxis:   IV Heparin    Code Status: Full Code Family Communication: spoke with wife and dtr at bedside Disposition Plan: return home Reason for continuing need for hospitalization: remains on IVF, IV heparin .  Objective: Vitals:   07/27/23 2359 07/28/23 0426 07/28/23 0832 07/28/23 1200  BP: 134/74 138/71 (!) 152/76 (!) 113/94  Pulse: (!) 109 84 (!) 121 (!) 128  Resp: 18 16 15    Temp: 98.3 F (36.8 C) 98.3 F (36.8 C) 98 F (36.7 C) 97.9 F (36.6 C)  TempSrc:  Oral Oral   SpO2: 93% 96% 94% 92%  Weight:      Height:        Intake/Output Summary (Last 24 hours) at 07/28/2023 1215 Last data filed at  07/28/2023 0001 Gross per 24 hour  Intake --  Output 425 ml  Net -425 ml   Filed Weights   07/27/23 1449  Weight: 109 kg    Examination:  Physical Exam Vitals and nursing note reviewed.  Constitutional:      General: He is not in acute distress.    Appearance: He is obese.  HENT:     Head: Normocephalic and atraumatic.     Nose: Nose normal.  Cardiovascular:     Rate and Rhythm: Normal rate and regular rhythm.  Pulmonary:     Effort: Pulmonary effort is normal.     Breath sounds: Normal breath sounds.  Abdominal:     General: Abdomen is protuberant. Bowel sounds are normal. There is no distension.     Palpations: Abdomen is soft.     Tenderness: There is no abdominal tenderness.  Musculoskeletal:     Right lower leg: No edema.     Left lower leg: No edema.  Skin:    General: Skin is warm and dry.     Capillary Refill: Capillary refill takes less than 2 seconds.  Neurological:     General: No focal deficit present.     Mental Status: He is alert and oriented to person, place, and time.    Data Reviewed: I  have personally reviewed following labs and imaging studies  CBC: Recent Labs  Lab 07/27/23 1119 07/28/23 0550  WBC 14.3* 8.4  NEUTROABS 12.2*  --   HGB 15.0 14.0  HCT 44.2 42.7  MCV 88.4 90.7  PLT 216 153   Basic Metabolic Panel: Recent Labs  Lab 07/27/23 1119 07/27/23 2035 07/27/23 2151 07/28/23 0157 07/28/23 0550  NA 125* 135 135 134* 135  K 5.4*  --   --   --  4.7  CL 94*  --   --   --  104  CO2 22  --   --   --  23  GLUCOSE 159*  --   --   --  122*  BUN 54*  --   --   --  42*  CREATININE 2.06*  --   --   --  1.67*  CALCIUM  8.9  --   --   --  8.7*   GFR: Estimated Creatinine Clearance: 39.9 mL/min (A) (by C-G formula based on SCr of 1.67 mg/dL (H)). Liver Function Tests: Recent Labs  Lab 07/27/23 1119 07/28/23 0550  AST 22 17  ALT 17 15  ALKPHOS 56 48  BILITOT 1.1 1.2  PROT 6.9 6.2*  ALBUMIN 3.6 3.2*   Coagulation  Profile: Recent Labs  Lab 07/27/23 1547  INR 1.0   Lipid Profile: Recent Labs    07/28/23 0550  CHOL 130  HDL 50  LDLCALC 62  TRIG 92  CHOLHDL 2.6   Sepsis Labs: Recent Labs  Lab 07/27/23 1119 07/27/23 1441  LATICACIDVEN 2.1* 1.3   Radiology Studies: ECHOCARDIOGRAM COMPLETE Result Date: 07/28/2023    ECHOCARDIOGRAM REPORT   Patient Name:   PAO GHAN Veals Date of Exam: 07/27/2023 Medical Rec #:  [id number]     Height:       71.0 in Accession #:    [id number]    Weight:       240.3 lb Date of Birth:  1934/08/31     BSA:          2.280 m Patient Age:    88 years      BP:           128/67 mmHg Patient Gender: M             HR:           102 bpm. Exam Location:  [facility] Procedure: 2D Echo, Cardiac Doppler, Color Doppler and Intracardiac            Opacification Agent (Both Spectral and Color Flow Doppler were            utilized during procedure). Indications:     NSTEMI I21.4  History:         Patient has no prior history of Echocardiogram examinations.                  Angina and Previous Myocardial Infarction.  Sonographer:     Terrilee Few RCS Referring Phys:  [phone] STEVEN J NEWTON Diagnosing Phys: Lida Reeks Alluri IMPRESSIONS  1. Left ventricular ejection fraction, by estimation, is 70 to 75%. The left ventricle has hyperdynamic function. The left ventricle has no regional wall motion abnormalities. There is mild left ventricular hypertrophy. Left ventricular diastolic parameters are indeterminate.  2. Right ventricular systolic function is normal. The right ventricular size is normal.  3. The mitral valve is normal in structure. No evidence of mitral valve regurgitation.  4. The aortic valve is tricuspid. There is  mild thickening of the aortic valve. Aortic valve regurgitation is not visualized. FINDINGS  Left Ventricle: Left ventricular ejection fraction, by estimation, is 70 to 75%. The left ventricle has hyperdynamic function. The left ventricle has no regional wall motion abnormalities.  Definity contrast agent was given IV to delineate the left ventricular endocardial borders. The left ventricular internal cavity size was normal in size. There is mild left ventricular hypertrophy. Left ventricular diastolic parameters are indeterminate. Right Ventricle: The right ventricular size is normal. No increase in right ventricular wall thickness. Right ventricular systolic function is normal. Left Atrium: Left atrial size was normal in size. Right Atrium: Right atrial size was normal in size. Pericardium: There is no evidence of pericardial effusion. Mitral Valve: The mitral valve is normal in structure. No evidence of mitral valve regurgitation. Tricuspid Valve: The tricuspid valve is normal in structure. Tricuspid valve regurgitation is trivial. Aortic Valve: The aortic valve is tricuspid. There is mild thickening of the aortic valve. Aortic valve regurgitation is not visualized. Aortic valve mean gradient measures 7.0 mmHg. Aortic valve peak gradient measures 14.3 mmHg. Aortic valve area, by VTI measures 2.31 cm. Pulmonic Valve: The pulmonic valve was not well visualized. Pulmonic valve regurgitation is not visualized. Aorta: The aortic root and ascending aorta are structurally normal, with no evidence of dilitation. IAS/Shunts: The atrial septum is grossly normal.  LEFT VENTRICLE PLAX 2D LVIDd:         4.05 cm   Diastology LVIDs:         2.65 cm   LV e' medial:    16.00 cm/s LV PW:         1.15 cm   LV E/e' medial:  6.0 LV IVS:        1.25 cm   LV e' lateral:   16.60 cm/s LVOT diam:     2.30 cm   LV E/e' lateral: 5.8 LV SV:         59 LV SV Index:   26 LVOT Area:     4.15 cm  RIGHT VENTRICLE RV S prime:     16.20 cm/s TAPSE (M-mode): 1.5 cm LEFT ATRIUM             Index        RIGHT ATRIUM           Index LA diam:        4.45 cm 1.95 cm/m   RA Area:     17.10 cm LA Vol (A2C):   67.2 ml 29.48 ml/m  RA Volume:   42.50 ml  18.64 ml/m LA Vol (A4C):   42.7 ml 18.73 ml/m LA Biplane Vol: 54.2 ml 23.78  ml/m  AORTIC VALVE AV Area (Vmax):    2.35 cm AV Area (Vmean):   2.27 cm AV Area (VTI):     2.31 cm AV Vmax:           189.00 cm/s AV Vmean:          124.000 cm/s AV VTI:            0.255 m AV Peak Grad:      14.3 mmHg AV Mean Grad:      7.0 mmHg LVOT Vmax:         107.00 cm/s LVOT Vmean:        67.600 cm/s LVOT VTI:          0.142 m LVOT/AV VTI ratio: 0.56  AORTA Ao Root diam: 3.50 cm Ao Asc diam:  3.90 cm MITRAL VALVE MV Area (PHT): 7.37 cm    SHUNTS MV Decel Time: 103 msec    Systemic VTI:  0.14 m MV E velocity: 96.40 cm/s  Systemic Diam: 2.30 cm MV A velocity: 50.30 cm/s MV E/A ratio:  1.92 Joetta Mustache Electronically signed by Joetta Mustache Signature Date/Time: 07/28/2023/9:41:55 AM    Final    CT ABDOMEN PELVIS WO CONTRAST Result Date: 07/28/2023 CLINICAL DATA:  Abdominal pain, acute, nonlocalized EXAM: CT ABDOMEN AND PELVIS WITHOUT CONTRAST TECHNIQUE: Multidetector CT imaging of the abdomen and pelvis was performed following the standard protocol without IV contrast. RADIATION DOSE REDUCTION: This exam was performed according to the departmental dose-optimization program which includes automated exposure control, adjustment of the mA and/or kV according to patient size and/or use of iterative reconstruction technique. COMPARISON:  Cm pelvis 12/04/2011 FINDINGS: Lower chest: Small hiatal hernia. Question distal esophageal wall thickening. Hepatobiliary: No focal liver abnormality. No gallstones, gallbladder wall thickening, or pericholecystic fluid. No biliary dilatation. Pancreas: Diffusely atrophic. No focal lesion. Otherwise normal pancreatic contour. No surrounding inflammatory changes. No main pancreatic ductal dilatation. Spleen: Normal in size without focal abnormality. Adrenals/Urinary Tract: No adrenal nodule bilaterally. No nephrolithiasis and no hydronephrosis. Fluid density lesions likely represent simple renal cysts. Subcentimeter hypodense lesions too small to characterize-no further  follow-up indicated. A 1.1 cm left renal lesion with a density of 91 Hounsfield units-due small to characterize follow-up. No ureterolithiasis or hydroureter. The urinary bladder is unremarkable. Stomach/Bowel: PO contrast reaches the mid small bowel. Stomach is within normal limits. No evidence of bowel wall thickening or dilatation. Several loops of small bowel along the anterior mid abdomen dilated with gas measuring up to 3.7 cm. No definite associated transition point. Distal small bowel appears decompressed. Colonic diverticulosis. Status post appendectomy. Vascular/Lymphatic: No abdominal aorta or iliac aneurysm. Severe atherosclerotic plaque of the aorta and its branches. No abdominal, pelvic, or inguinal lymphadenopathy. Reproductive: Prostate is enlarged measuring up to 6.1 cm. Other: No intraperitoneal free fluid. No intraperitoneal free gas. No organized fluid collection. Musculoskeletal: Partially visualized left anterior thigh soft tissue density measuring at least 5 x 2.5 cm. Partially visualized redemonstration of a fat density lesion along the left hip vastus lateralis muscle (2:101). No suspicious lytic or blastic osseous lesions. No acute displaced fracture. Multilevel severe degenerative changes of the spine. IMPRESSION: 1. Small hiatal hernia with question distal esophageal wall thickening. Correlate for esophagitis. Underlying mass not excluded. 2. Findings could represent developing early versus partial small bowel obstruction. Consider PO contrast administration and serial abdominal radiographs to evaluate passage of PO contrast to the colon. 3. Colonic diverticulosis with no acute diverticulitis. 4.  Aortic Atherosclerosis (ICD10-I70.0). 5. Partially visualized left anterior thigh soft tissue density measuring at least 5 x 2.5 cm. Recommend further evaluation with grayscale and color Doppler ultrasound. 6. Partially visualized redemonstration of a left hip vastus lateralis intramuscular  lipomatous lesion. Electronically Signed   By: Morgane  Naveau M.D.   On: 07/28/2023 02:40    Scheduled Meds:  [START ON 07/29/2023] aspirin  EC  81 mg Oral Daily   atorvastatin   40 mg Oral Daily   gabapentin  300 mg Oral QHS   metoprolol  tartrate  12.5 mg Oral BID   pantoprazole  (PROTONIX ) IV  40 mg Intravenous Q12H   rOPINIRole   1 mg Oral QHS   Continuous Infusions:  heparin  1,400 Units/hr (07/28/23 0850)   lactated ringers 100 mL/hr at 07/28/23 0843   promethazine (PHENERGAN) injection (IM or IVPB) 12.5 mg (07/27/23  1835)     LOS: 0 days   Time spent: 50 minutes  Unk Garb, DO  [facility]  07/28/2023, 12:15 PM

## 2023-07-28 NOTE — Consult Note (Signed)
 PHARMACY - ANTICOAGULATION CONSULT NOTE  Pharmacy Consult for Heparin  Indication: chest pain/ACS  No Known Allergies  Patient Measurements: Height: 5\' 11"  (180.3 cm) Weight: 109 kg (240 lb 4.8 oz) IBW/kg (Calculated) : 75.3 HEPARIN  DW (KG): 98.6  Vital Signs: Temp: 98 F (36.7 C) (05/10 0832) Temp Source: Oral (05/10 0832) BP: 152/76 (05/10 0832) Pulse Rate: 121 (05/10 0832)  Labs: Recent Labs    07/27/23 1119 07/27/23 1547 07/27/23 2035 07/27/23 2151 07/28/23 0018 07/28/23 0550 07/28/23 0810  HGB 15.0  --   --   --   --  14.0  --   HCT 44.2  --   --   --   --  42.7  --   PLT 216  --   --   --   --  153  --   APTT  --  33  --   --   --   --   --   LABPROT  --  13.7  --   --   --   --   --   INR  --  1.0  --   --   --   --   --   HEPARINUNFRC  --   --   --   --  0.50  --  0.52  CREATININE 2.06*  --   --   --   --  1.67*  --   TROPONINIHS 125*  --  202* 182*  --   --   --     Estimated Creatinine Clearance: 39.9 mL/min (A) (by C-G formula based on SCr of 1.67 mg/dL (H)).   Medical History: Past Medical History:  Diagnosis Date   BPH (benign prostatic hyperplasia)    CAD (coronary artery disease)    Carotid stenosis    Hyperlipidemia    Hypertension    PAD (peripheral artery disease) (HCC)    RLS (restless legs syndrome)     Medications:  No history of chronic anticoagulation use PTA.  Assessment: 89 y.o. male with a past medical history of CAD, hypertension, hyperlipidemia, presents to the emergency department for generalized weakness dizziness and continued left lower back pain. Troponin level of 125. Pharmacy has been consulted to dose continuous heparin  infusion.  Baseline labs: Hgb 15.0, Plts 216, aPTT 33 sec, INR 1.0 Goal of Therapy:  Heparin  level 0.3-0.7 units/ml Monitor platelets by anticoagulation protocol: Yes   Plan:  5/10:  HL @ 0810 = 0.52, therapeutic X 2 - Will continue pt on current rate and recheck HL with tomorrow with morning  labs - CBC daily   Ewart Carrera A Brezlyn Manrique 07/28/2023,9:14 AM

## 2023-07-28 NOTE — Consult Note (Signed)
 PHARMACY - ANTICOAGULATION CONSULT NOTE  Pharmacy Consult for Heparin  Indication: chest pain/ACS  No Known Allergies  Patient Measurements: Height: 5\' 11"  (180.3 cm) Weight: 109 kg (240 lb 4.8 oz) IBW/kg (Calculated) : 75.3 HEPARIN  DW (KG): 98.6  Vital Signs: Temp: 98.3 F (36.8 C) (05/09 2359) BP: 134/74 (05/09 2359) Pulse Rate: 109 (05/09 2359)  Labs: Recent Labs    07/27/23 1119 07/27/23 1547 07/27/23 2035 07/27/23 2151 07/28/23 0018  HGB 15.0  --   --   --   --   HCT 44.2  --   --   --   --   PLT 216  --   --   --   --   APTT  --  33  --   --   --   LABPROT  --  13.7  --   --   --   INR  --  1.0  --   --   --   HEPARINUNFRC  --   --   --   --  0.50  CREATININE 2.06*  --   --   --   --   TROPONINIHS 125*  --  202* 182*  --     Estimated Creatinine Clearance: 32.3 mL/min (A) (by C-G formula based on SCr of 2.06 mg/dL (H)).   Medical History: Past Medical History:  Diagnosis Date   BPH (benign prostatic hyperplasia)    CAD (coronary artery disease)    Carotid stenosis    Hyperlipidemia    Hypertension    PAD (peripheral artery disease) (HCC)    RLS (restless legs syndrome)     Medications:  No history of chronic anticoagulation use PTA.  Assessment: 89 y.o. male with a past medical history of CAD, hypertension, hyperlipidemia, presents to the emergency department for generalized weakness dizziness and continued left lower back pain. Troponin level of 125. Pharmacy has been consulted to dose continuous heparin  infusion.  Baseline labs: Hgb 15.0, Plts 216, aPTT 33 sec, INR 1.0 Goal of Therapy:  Heparin  level 0.3-0.7 units/ml Monitor platelets by anticoagulation protocol: Yes   Plan:  5/10:  HL @ 0018 = 0.50, therapeutic X 1 - Will continue pt on current rate and recheck HL in 8 hrs. - CBC daily   Ceaira Ernster D 07/28/2023,1:05 AM

## 2023-07-28 NOTE — Consult Note (Signed)
 Joel Mata  Mata CONSULT NOTE  Patient ID: BOLUWATIFE GAVETTE MRN: [id number] DOB/AGE: 1934-08-28 89 y.o.  Admit date: 07/27/2023 Referring Physician Dr. Farrel Hones Reason for Consultation NSTEMI  HPI: 89 year old male with past medical history of CAD with prior PCI 1989, CABG 2009, hypertension, hyperlipidemia who presented to hospital with nausea and vomiting/not feeling well.  In last couple weeks he has significant hip pain issues, underwent shot in the hip, started to have symptoms of nausea/vomiting with poor appetite, noted bile in vomiting.  No complaints of chest pain/pressure, shortness of breath.  Came to ED where EKG showed sinus rhythm with T wave inversion in lateral leads which is unchanged from prior EKG.  High-sensitivity troponin mildly elevated and more of flat 125--200--180.  In ED he was noted to be hypotensive with systolic in the 70s which improved with IV fluids, dehydrated leading to acute kidney injury.  Echocardiogram with normal biventricular systolic function, LVEF 70 to 40% with no wall motion abnormalities.  No significant valvular dysfunction.  Patient is accompanied by his wife and family at bedside.  Detailed that this morning he had an episode of sharp left-sided chest pain lasting for 10 to 15 seconds which I explained is not cardiac.  Review of systems complete and found to be negative unless listed above     Past Medical History:  Diagnosis Date   BPH (benign prostatic hyperplasia)    CAD (coronary artery disease)    Carotid stenosis    Hyperlipidemia    Hypertension    PAD (peripheral artery disease) (HCC)    RLS (restless legs syndrome)     Past Surgical History:  Procedure Laterality Date   APPENDECTOMY     CARDIAC CATHETERIZATION N/A 07/20/2014   Procedure: Left Heart Cath and Cors/Grafts Angiography;  Surgeon: Percival Brace, MD;  Location: [facility] INVASIVE CV LAB;  Service: Cardiovascular;  Laterality: N/A;   CATARACT EXTRACTION Bilateral     CORONARY ARTERY BYPASS GRAFT  2009   ENDOSCOPIC CARPAL TUNNEL RELEASE Left 2005   Left CEA  2006    Medications Prior to Admission  Medication Sig Dispense Refill Last Dose/Taking   Acetaminophen  (TYLENOL  PO) Take by mouth as needed.   Taking As Needed   aspirin  EC 81 MG tablet Take 81 mg by mouth daily. Swallow whole.   07/26/2023   atorvastatin  (LIPITOR) 40 MG tablet Take 40 mg by mouth daily.   07/26/2023   beta carotene w/minerals (OCUVITE) tablet Take 1 tablet by mouth daily with breakfast.   07/26/2023   colchicine 0.6 MG tablet Take 2 tablets (1.2 mg) at onset of gout flare. Take 1 additional tablet (0.6 mg) one hour later if symptoms persist. Maximum dose is 1.8 mg (3 tablets) in 24 hours. Then take one tablet (0.6 mg) daily until symptoms resolve.   07/26/2023   cyclobenzaprine  (FLEXERIL ) 5 MG tablet Take 5 mg by mouth daily as needed.   Taking As Needed   dutasteride  (AVODART ) 0.5 MG capsule Take 0.5 mg by mouth daily.   07/26/2023   finasteride (PROSCAR) 5 MG tablet Take 1 tablet by mouth 3 (three) times a week.   07/26/2023   gabapentin (NEURONTIN) 100 MG capsule Take 300 mg by mouth at bedtime.   07/26/2023   gabapentin (NEURONTIN) 100 MG capsule Take 3 capsules by mouth at bedtime.   07/26/2023   isosorbide  mononitrate (IMDUR ) 60 MG 24 hr tablet Take 1 tablet by mouth daily.   07/26/2023   lisinopril  (PRINIVIL ,ZESTRIL ) 30 MG tablet  Take 30 mg by mouth daily.   07/26/2023   rOPINIRole  (REQUIP ) 1 MG tablet Take 1 mg by mouth at bedtime.   07/26/2023   traMADol (ULTRAM) 50 MG tablet Take 50 mg by mouth 2 (two) times daily as needed.   Taking As Needed   clopidogrel  (PLAVIX ) 75 MG tablet Take 1 tablet (75 mg total) by mouth daily. (Patient not taking: Reported on 07/27/2023) 30 tablet 5 Not Taking   ferrous sulfate 325 (65 FE) MG tablet Take 325 mg by mouth 3 (three) times a week. (Patient not taking: Reported on 07/27/2023)   Not Taking   metoprolol  tartrate (LOPRESSOR ) 25 MG tablet Take 1 tablet (25 mg  total) by mouth 2 (two) times daily. (Patient not taking: Reported on 07/27/2023) 60 tablet 5 Not Taking   nitroGLYCERIN  (NITROSTAT ) 0.4 MG SL tablet Place 0.4 mg under the tongue as directed. (Patient not taking: Reported on 07/27/2023)   Not Taking   tiZANidine (ZANAFLEX) 4 MG tablet Take 4 mg by mouth 3 (three) times daily. (Patient not taking: Reported on 07/27/2023)   Not Taking   Social History   Socioeconomic History   Marital status: Married    Spouse name: Not on file   Number of children: Not on file   Years of education: Not on file   Highest education level: Not on file  Occupational History   Not on file  Tobacco Use   Smoking status: Never   Smokeless tobacco: Never  Vaping Use   Vaping status: Never Used  Substance and Sexual Activity   Alcohol use: Not on file   Drug use: Not on file   Sexual activity: Not on file  Other Topics Concern   Not on file  Social History Narrative   Not on file   Social Drivers of Health   Financial Resource Strain: Low Risk  (07/13/2023)   Received from [facility]   Overall Financial Resource Strain (CARDIA)    Difficulty of Paying Living Expenses: Not hard at all  Food Insecurity: No Food Insecurity (07/27/2023)   Hunger Vital Sign    Worried About Running Out of Food in the Last Year: Never true    Ran Out of Food in the Last Year: Never true  Transportation Needs: No Transportation Needs (07/27/2023)   PRAPARE - Administrator, Civil Service (Medical): No    Lack of Transportation (Non-Medical): No  Physical Activity: Not on file  Stress: Not on file  Social Connections: Moderately Integrated (07/27/2023)   Social Connection and Isolation Panel [NHANES]    Frequency of Communication with Friends and Family: More than three times a week    Frequency of Social Gatherings with Friends and Family: Twice a week    Attends Religious Services: 1 to 4 times per year    Active Member of [employer] or Organizations: No     Attends Banker Meetings: Never    Marital Status: Married  Catering manager Violence: Not At Risk (07/27/2023)   Humiliation, Afraid, Rape, and Kick questionnaire    Fear of Current or Ex-Partner: No    Emotionally Abused: No    Physically Abused: No    Sexually Abused: No    History reviewed. No pertinent family history.    Review of systems complete and found to be negative unless listed above      PHYSICAL EXAM  General: Well developed, well nourished, in no acute distress HEENT:  Normocephalic and atramatic  Neck:  No JVD.  Lungs: Clear bilaterally to auscultation and percussion. Heart: HRRR . Normal S1 and S2 without gallops or murmurs.  Abdomen: Bowel sounds are positive, abdomen soft and non-tender  Msk:  Back normal, normal gait. Normal strength and tone for age. Extremities: No clubbing, cyanosis or edema.   Neuro: Alert and oriented X 3. Psych:  Good affect, responds appropriately  Labs:   Lab Results  Component Value Date   WBC 8.4 07/28/2023   HGB 14.0 07/28/2023   HCT 42.7 07/28/2023   MCV 90.7 07/28/2023   PLT 153 07/28/2023    Recent Labs  Lab 07/28/23 0550  NA 135  K 4.7  CL 104  CO2 23  BUN 42*  CREATININE 1.67*  CALCIUM  8.7*  PROT 6.2*  BILITOT 1.2  ALKPHOS 48  ALT 15  AST 17  GLUCOSE 122*   Lab Results  Component Value Date   CKTOTAL 402 (H) 07/19/2014   CKMB 41.9 (H) 07/19/2014   TROPONINI 8.74 (H) 07/20/2014    Lab Results  Component Value Date   CHOL 130 07/28/2023   CHOL 225 (H) 07/18/2014   Lab Results  Component Value Date   HDL 50 07/28/2023   HDL 41 07/18/2014   Lab Results  Component Value Date   LDLCALC 62 07/28/2023   LDLCALC 151 (H) 07/18/2014   Lab Results  Component Value Date   TRIG 92 07/28/2023   TRIG 166 (H) 07/18/2014   Lab Results  Component Value Date   CHOLHDL 2.6 07/28/2023   No results found for: "LDLDIRECT"    Radiology: ECHOCARDIOGRAM COMPLETE Result Date: 07/28/2023     ECHOCARDIOGRAM REPORT   Patient Name:   RUSTAM SQUILLACE Talley Date of Exam: 07/27/2023 Medical Rec #:  [id number]     Height:       71.0 in Accession #:    [id number]    Weight:       240.3 lb Date of Birth:  Sep 03, 1934     BSA:          2.280 m Patient Age:    88 years      BP:           128/67 mmHg Patient Gender: M             HR:           102 bpm. Exam Location:  [facility] Procedure: 2D Echo, Cardiac Doppler, Color Doppler and Intracardiac            Opacification Agent (Both Spectral and Color Flow Doppler were            utilized during procedure). Indications:     NSTEMI I21.4  History:         Patient has no prior history of Echocardiogram examinations.                  Angina and Previous Myocardial Infarction.  Sonographer:     Terrilee Few RCS Referring Phys:  [phone] STEVEN J NEWTON Diagnosing Phys: Lida Reeks Teara Duerksen IMPRESSIONS  1. Left ventricular ejection fraction, by estimation, is 70 to 75%. The left ventricle has hyperdynamic function. The left ventricle has no regional wall motion abnormalities. There is mild left ventricular hypertrophy. Left ventricular diastolic parameters are indeterminate.  2. Right ventricular systolic function is normal. The right ventricular size is normal.  3. The mitral valve is normal in structure. No evidence of mitral valve regurgitation.  4. The aortic valve is tricuspid. There  is mild thickening of the aortic valve. Aortic valve regurgitation is not visualized. FINDINGS  Left Ventricle: Left ventricular ejection fraction, by estimation, is 70 to 75%. The left ventricle has hyperdynamic function. The left ventricle has no regional wall motion abnormalities. Definity contrast agent was given IV to delineate the left ventricular endocardial borders. The left ventricular internal cavity size was normal in size. There is mild left ventricular hypertrophy. Left ventricular diastolic parameters are indeterminate. Right Ventricle: The right ventricular size is normal. No increase in right  ventricular wall thickness. Right ventricular systolic function is normal. Left Atrium: Left atrial size was normal in size. Right Atrium: Right atrial size was normal in size. Pericardium: There is no evidence of pericardial effusion. Mitral Valve: The mitral valve is normal in structure. No evidence of mitral valve regurgitation. Tricuspid Valve: The tricuspid valve is normal in structure. Tricuspid valve regurgitation is trivial. Aortic Valve: The aortic valve is tricuspid. There is mild thickening of the aortic valve. Aortic valve regurgitation is not visualized. Aortic valve mean gradient measures 7.0 mmHg. Aortic valve peak gradient measures 14.3 mmHg. Aortic valve area, by VTI measures 2.31 cm. Pulmonic Valve: The pulmonic valve was not well visualized. Pulmonic valve regurgitation is not visualized. Aorta: The aortic root and ascending aorta are structurally normal, with no evidence of dilitation. IAS/Shunts: The atrial septum is grossly normal.  LEFT VENTRICLE PLAX 2D LVIDd:         4.05 cm   Diastology LVIDs:         2.65 cm   LV e' medial:    16.00 cm/s LV PW:         1.15 cm   LV E/e' medial:  6.0 LV IVS:        1.25 cm   LV e' lateral:   16.60 cm/s LVOT diam:     2.30 cm   LV E/e' lateral: 5.8 LV SV:         59 LV SV Index:   26 LVOT Area:     4.15 cm  RIGHT VENTRICLE RV S prime:     16.20 cm/s TAPSE (M-mode): 1.5 cm LEFT ATRIUM             Index        RIGHT ATRIUM           Index LA diam:        4.45 cm 1.95 cm/m   RA Area:     17.10 cm LA Vol (A2C):   67.2 ml 29.48 ml/m  RA Volume:   42.50 ml  18.64 ml/m LA Vol (A4C):   42.7 ml 18.73 ml/m LA Biplane Vol: 54.2 ml 23.78 ml/m  AORTIC VALVE AV Area (Vmax):    2.35 cm AV Area (Vmean):   2.27 cm AV Area (VTI):     2.31 cm AV Vmax:           189.00 cm/s AV Vmean:          124.000 cm/s AV VTI:            0.255 m AV Peak Grad:      14.3 mmHg AV Mean Grad:      7.0 mmHg LVOT Vmax:         107.00 cm/s LVOT Vmean:        67.600 cm/s LVOT VTI:           0.142 m LVOT/AV VTI ratio: 0.56  AORTA Ao Root diam: 3.50 cm Ao Asc  diam:  3.90 cm MITRAL VALVE MV Area (PHT): 7.37 cm    SHUNTS MV Decel Time: 103 msec    Systemic VTI:  0.14 m MV E velocity: 96.40 cm/s  Systemic Diam: 2.30 cm MV A velocity: 50.30 cm/s MV E/A ratio:  1.92 Joetta Mustache Electronically signed by Joetta Mustache Signature Date/Time: 07/28/2023/9:41:55 AM    Final    CT ABDOMEN PELVIS WO CONTRAST Result Date: 07/28/2023 CLINICAL DATA:  Abdominal pain, acute, nonlocalized EXAM: CT ABDOMEN AND PELVIS WITHOUT CONTRAST TECHNIQUE: Multidetector CT imaging of the abdomen and pelvis was performed following the standard protocol without IV contrast. RADIATION DOSE REDUCTION: This exam was performed according to the departmental dose-optimization program which includes automated exposure control, adjustment of the mA and/or kV according to patient size and/or use of iterative reconstruction technique. COMPARISON:  Cm pelvis 12/04/2011 FINDINGS: Lower chest: Small hiatal hernia. Question distal esophageal wall thickening. Hepatobiliary: No focal liver abnormality. No gallstones, gallbladder wall thickening, or pericholecystic fluid. No biliary dilatation. Pancreas: Diffusely atrophic. No focal lesion. Otherwise normal pancreatic contour. No surrounding inflammatory changes. No main pancreatic ductal dilatation. Spleen: Normal in size without focal abnormality. Adrenals/Urinary Tract: No adrenal nodule bilaterally. No nephrolithiasis and no hydronephrosis. Fluid density lesions likely represent simple renal cysts. Subcentimeter hypodense lesions too small to characterize-no further follow-up indicated. A 1.1 cm left renal lesion with a density of 91 Hounsfield units-due small to characterize follow-up. No ureterolithiasis or hydroureter. The urinary bladder is unremarkable. Stomach/Bowel: PO contrast reaches the mid small bowel. Stomach is within normal limits. No evidence of bowel wall thickening or  dilatation. Several loops of small bowel along the anterior mid abdomen dilated with gas measuring up to 3.7 cm. No definite associated transition point. Distal small bowel appears decompressed. Colonic diverticulosis. Status post appendectomy. Vascular/Lymphatic: No abdominal aorta or iliac aneurysm. Severe atherosclerotic plaque of the aorta and its branches. No abdominal, pelvic, or inguinal lymphadenopathy. Reproductive: Prostate is enlarged measuring up to 6.1 cm. Other: No intraperitoneal free fluid. No intraperitoneal free gas. No organized fluid collection. Musculoskeletal: Partially visualized left anterior thigh soft tissue density measuring at least 5 x 2.5 cm. Partially visualized redemonstration of a fat density lesion along the left hip vastus lateralis muscle (2:101). No suspicious lytic or blastic osseous lesions. No acute displaced fracture. Multilevel severe degenerative changes of the spine. IMPRESSION: 1. Small hiatal hernia with question distal esophageal wall thickening. Correlate for esophagitis. Underlying mass not excluded. 2. Findings could represent developing early versus partial small bowel obstruction. Consider PO contrast administration and serial abdominal radiographs to evaluate passage of PO contrast to the colon. 3. Colonic diverticulosis with no acute diverticulitis. 4.  Aortic Atherosclerosis (ICD10-I70.0). 5. Partially visualized left anterior thigh soft tissue density measuring at least 5 x 2.5 cm. Recommend further evaluation with grayscale and color Doppler ultrasound. 6. Partially visualized redemonstration of a left hip vastus lateralis intramuscular lipomatous lesion. Electronically Signed   By: Morgane  Naveau M.D.   On: 07/28/2023 02:40    EKG: Sinus tachycardia on monitor  ASSESSMENT AND PLAN:  NSTEMI, likely type II CAD with prior PCI and CABG Nausea, vomiting leading to dehydration/hypotension.  Blood pressure improved with IV hydration Acute kidney injury,  improving hypertension, hyperlipidemia  NSTEMI is likely type II.  He has no cardiac complaints, EKG without no new changes.  Echocardiogram with normal biventricular systolic function and no wall motion abnormalities. Heparin  drip for 48 hours if no contraindication Aspirin , statin. Will resume p.o. metoprolol .  Continue to  hold lisinopril  with acute kidney injury. He was dehydrated from nausea vomiting leading to hypotension and acute kidney injury.  Currently blood pressure normalized with IV hydration and renal function improving.  Still having GI symptoms.  Need increased hydration and further management per primary team.  Signed: Janette Medley MD 07/28/2023, 11:20 AM

## 2023-07-28 NOTE — Assessment & Plan Note (Signed)
Body mass index is 33.52 kg/m.

## 2023-07-28 NOTE — Subjective & Objective (Signed)
 Pt seen and examined. Met with wife and dtr at bedside. Last bout of vomiting around 7 pm last night. Has passed gas.  Repeat abd xray shows contrast down to descending colon/sigmoid colon. Tolerating clear liquids.

## 2023-07-29 ENCOUNTER — Encounter: Payer: Self-pay | Admitting: Internal Medicine

## 2023-07-29 ENCOUNTER — Inpatient Hospital Stay

## 2023-07-29 DIAGNOSIS — E871 Hypo-osmolality and hyponatremia: Secondary | ICD-10-CM | POA: Diagnosis not present

## 2023-07-29 DIAGNOSIS — I10 Essential (primary) hypertension: Secondary | ICD-10-CM

## 2023-07-29 DIAGNOSIS — I214 Non-ST elevation (NSTEMI) myocardial infarction: Secondary | ICD-10-CM | POA: Diagnosis not present

## 2023-07-29 DIAGNOSIS — R935 Abnormal findings on diagnostic imaging of other abdominal regions, including retroperitoneum: Secondary | ICD-10-CM | POA: Diagnosis not present

## 2023-07-29 DIAGNOSIS — R112 Nausea with vomiting, unspecified: Secondary | ICD-10-CM

## 2023-07-29 DIAGNOSIS — N179 Acute kidney failure, unspecified: Secondary | ICD-10-CM | POA: Diagnosis not present

## 2023-07-29 LAB — CBC WITH DIFFERENTIAL/PLATELET
Abs Immature Granulocytes: 0.01 10*3/uL (ref 0.00–0.07)
Basophils Absolute: 0 10*3/uL (ref 0.0–0.1)
Basophils Relative: 0 %
Eosinophils Absolute: 0.1 10*3/uL (ref 0.0–0.5)
Eosinophils Relative: 2 %
HCT: 38.9 % — ABNORMAL LOW (ref 39.0–52.0)
Hemoglobin: 13 g/dL (ref 13.0–17.0)
Immature Granulocytes: 0 %
Lymphocytes Relative: 11 %
Lymphs Abs: 0.8 10*3/uL (ref 0.7–4.0)
MCH: 30.2 pg (ref 26.0–34.0)
MCHC: 33.4 g/dL (ref 30.0–36.0)
MCV: 90.3 fL (ref 80.0–100.0)
Monocytes Absolute: 0.7 10*3/uL (ref 0.1–1.0)
Monocytes Relative: 10 %
Neutro Abs: 5 10*3/uL (ref 1.7–7.7)
Neutrophils Relative %: 77 %
Platelets: 151 10*3/uL (ref 150–400)
RBC: 4.31 MIL/uL (ref 4.22–5.81)
RDW: 12.6 % (ref 11.5–15.5)
WBC: 6.6 10*3/uL (ref 4.0–10.5)
nRBC: 0 % (ref 0.0–0.2)

## 2023-07-29 LAB — BASIC METABOLIC PANEL WITH GFR
Anion gap: 9 (ref 5–15)
BUN: 36 mg/dL — ABNORMAL HIGH (ref 8–23)
CO2: 21 mmol/L — ABNORMAL LOW (ref 22–32)
Calcium: 8.6 mg/dL — ABNORMAL LOW (ref 8.9–10.3)
Chloride: 105 mmol/L (ref 98–111)
Creatinine, Ser: 1.53 mg/dL — ABNORMAL HIGH (ref 0.61–1.24)
GFR, Estimated: 44 mL/min — ABNORMAL LOW (ref >60)
Glucose, Bld: 91 mg/dL (ref 70–99)
Potassium: 4.5 mmol/L (ref 3.5–5.1)
Sodium: 135 mmol/L (ref 135–145)

## 2023-07-29 LAB — HEPARIN LEVEL (UNFRACTIONATED): Heparin Unfractionated: 0.55 [IU]/mL (ref 0.30–0.70)

## 2023-07-29 LAB — MAGNESIUM: Magnesium: 2.2 mg/dL (ref 1.7–2.4)

## 2023-07-29 MED ORDER — ISOSORBIDE MONONITRATE ER 30 MG PO TB24
60.0000 mg | ORAL_TABLET | Freq: Every day | ORAL | Status: DC
  Administered 2023-07-29: 60 mg via ORAL
  Filled 2023-07-29: qty 2

## 2023-07-29 MED ORDER — PANTOPRAZOLE SODIUM 40 MG PO TBEC
40.0000 mg | DELAYED_RELEASE_TABLET | Freq: Every day | ORAL | Status: DC
Start: 2023-07-29 — End: 2023-07-30
  Administered 2023-07-29: 40 mg via ORAL
  Filled 2023-07-29: qty 1

## 2023-07-29 MED ORDER — SIMETHICONE 80 MG PO CHEW
160.0000 mg | CHEWABLE_TABLET | Freq: Four times a day (QID) | ORAL | Status: DC
  Administered 2023-07-29 (×2): 160 mg via ORAL
  Filled 2023-07-29 (×2): qty 2

## 2023-07-29 NOTE — Progress Notes (Signed)
 Mobility Specialist - Progress Note   07/29/23 1120  Mobility  Activity Ambulated with assistance in hallway;Stood at bedside;Dangled on edge of bed  Level of Assistance Standby assist, set-up cues, supervision of patient - no hands on  Assistive Device Other (Comment) (IV Pole)  Distance Ambulated (ft) 180 ft  Activity Response Tolerated well  Mobility Referral Yes  Mobility visit 1 Mobility  Mobility Specialist Start Time (ACUTE ONLY) 1004  Mobility Specialist Stop Time (ACUTE ONLY) 1021  Mobility Specialist Time Calculation (min) (ACUTE ONLY) 17 min   Pt supine in bed on RA upon arrival. Pt STS and ambulates in hallway SBA. Pt returns to bed with needs in reach and family present.   Wash Hack  Mobility Specialist  07/29/23 11:21 AM

## 2023-07-29 NOTE — Discharge Summary (Signed)
 [facility] Physician Discharge Summary   Patient name: Joel Mata  Admit date:     07/27/2023  Discharge date: 07/29/2023  Attending Physician: Farrel Hones, Ajanee Buren [[id number]]  Discharge Physician: Unk Garb   PCP: Antonio Baumgarten, MD  Admitted From: Home  Disposition:  Home  Recommendations for Outpatient Follow-up:  Follow up with PCP in 1-2 weeks Will need repeat BMP next week at PCP office to decide of ACEI can be restarted  Home Health:No Equipment/Devices: None    Discharge Condition:Stable CODE STATUS:FULL Diet recommendation: Heart Healthy Fluid Restriction: None  Hospital Summary: HPI: Joel Mata is a 89 y.o. male with medical history significant of coronary artery disease, hyperlipidemia, hypertension, peripheral vascular disease, restless leg presenting with hyponatremia, AKI, NSTEMI.  Patient noted to have been seen last week for hip bursitis.  Received a steroid shot.  Per report, had improvement in symptoms though worsening back pain over the past 4 to 5 days.  Was seen by orthopedic surgery with recommendations of symptoms likely coming from radicular lumbar pain.  Per the wife, patient had recurring episodes of nausea and vomiting as well as decreased p.o. intake over the past 3 to 4 days.  No fevers or chills.  No reported recent sick contacts.  No chest pain or shortness of breath.  Nausea has been nonbloody and nonbilious.  No bowel or bladder anesthesia.  No reported urinary incontinence. Presented to the ER afebrile, initial systolic pressures in the 70s-improved to the 120s with fluid bolus.  Satting well on room air.  White count 14.3, hemoglobin 15, platelets 216, creatinine 2.06, glucose 159, sodium 125. Troponin 125. EKG NSR.  Review of Systems: As mentioned in the history of present illness. All other systems reviewed and are negative.  Significant Events: Admitted 07/27/2023 for hyponatremia   Significant Labs: Na 125, K 5.4, CO2 of 22, BUN 54, Scr  2.06, glu 159 WBC 14.3, HgB 15, plt 216 Troponin I 125->202->182  Significant Imaging Studies: CT abd/pelvis Small hiatal hernia with question distal esophageal wall thickening. Correlate for esophagitis. Underlying mass not excluded. 2. Findings could represent developing early versus partial small bowel obstruction. Consider PO contrast administration and serial abdominal radiographs to evaluate passage of PO contrast to the colon. 3. Colonic diverticulosis with no acute diverticulitis. 4.  Aortic Atherosclerosis (ICD10-I70.0). 5. Partially visualized left anterior thigh soft tissue density measuring at least 5 x 2.5 cm. Recommend further evaluation with grayscale and color Doppler ultrasound. 6. Partially visualized redemonstration of a left hip vastus lateralis intramuscular lipomatous lesion.  Antibiotic Therapy: Anti-infectives (From admission, onward)    None       Procedures:   Consultants: cardiology   Hospital Course by Problem: * Hyponatremia-resolved as of 07/29/2023 On admission. Sodium 125 on presentation Clinically dry in setting of recurrent nausea vomiting IV fluid hydration Trend sodium Urine and serum studies Goal 6-8 mill equivalent change over the next 12 to 24 hours  07-28-2023 pt with hypovolemic/hyponatremia. Na is normal now that his hypovolemia has been corrected.  07-29-2023 resolved. Cortisol level normal yesterday.  NSTEMI (non-ST elevated myocardial infarction) (HCC)-resolved as of 07/29/2023 On admission. Baseline coronary disease followed by Dr. Parks Bollman outpatient Troponin 120s on presentation with noted recurrent nausea EKG stable Started on a heparin  drip in the ER-continue Trend troponin 2D echo Follow cardiology recommendations  07-28-2023 likely demand ischemia due to hypotension/hypovolemia. Cards wants to continue IV heparin  for a total of 48 hours. Echo with hyperdynamic LVEF. 70%  07-29-2023 defer to  cardiology. Hopefully IV  heparin  can stop today.   Lumbar back pain On admission. Noted lumbar back pain followed by outpatient orthopedic surgery No bowel or bladder incontinence Will check lumbar plain films x 1 Pain control Monitor  07-28-2023 wife states pt able to roll back and forth in bed last night without any difficulty. He has not been able to do that is several weeks due to back pain. PT consult.  07-29-2023 pt no longer having acute back pain. Has chronic back pain.  AKI (acute kidney injury) (HCC) On admission. Creatinine 2 on presentation Clinically dry IV fluid hydration Hold offending agents Monitor  07-28-2023 Scr 1.67. on admission, Scr 2.06. at baseline scr 1.2.  continue with IVF. Monitor Scr  07-29-2023 Scr 1.53 today. Baseline 1.2. will stop IVF today and allow pt to hydrate by himself.  *update. Pt wants to go home. Will hold ACEI at discharge. Will need repeat BMP in PCP office next week.  Abnormal CT of the abdomen-resolved as of 07/29/2023 07-28-2023 CT abd shows possible early/developing SBO. Pt without any further vomiting this AM. Pt is passing flatus. He wants something to eat/drink. Will allow for clear liquids. Give po gastrograffin and check abd xr in 8 hours. Hopefully contrast reaches his rectum and SBO can be ruled out.  07-29-2023 gastrograffin contrast within descending colon/sigmoid colon. No obstruction. Change to solid food.   *update. Pt has eaten solid food for breakfast and lunch without difficulty. Had large BM this afternoon. Ready for DC. Wife agreeable to taking him home this evening  Nausea and vomiting-resolved as of 07/29/2023 On admission. Noted recurrent nausea and vomiting x 24  Non bloody-non bilious emesis  CT A&P pending to better assess  Noted recent steroid use  IV PPI  Noted secondary NSTEMI is a confounding issue  Monitor   07-28-2023 CT abd shows possible early/developing SBO. Pt without any further vomiting this AM. Pt is passing flatus.  He wants something to eat/drink. Will allow for clear liquids. Give po gastrograffin and check abd xr in 8 hours. Hopefully contrast reaches his rectum and SBO can be ruled out   07-29-2023 gastrograffin contrast within descending colon/sigmoid colon. No obstruction. Change to solid food.  *update. Pt has eaten solid food for breakfast and lunch without difficulty. Had large BM this afternoon. Ready for DC. Wife agreeable to taking him home this evening  Obesity, Class I, BMI 30-34.9 Body mass index is 33.52 kg/m.   Hyperlipidemia 07-28-2023 Continue statin  07-29-2023 on statin.  Hypertension On admission.  BP stable. Titrate home regimen  07-28-2023 given his volume depletion, AKI. Hold ACEI. Ok for lopressor  due to NSTEMI.  07-29-2023 on lopressor  12.5 mg bid. Continue to hold ACEI due to AKI.    Discharge Diagnoses:  Active Problems:   AKI (acute kidney injury) (HCC)   Lumbar back pain   Hypertension   Hyperlipidemia   Obesity, Class I, BMI 30-34.9   Discharge Instructions  Discharge Instructions     Call MD for:  difficulty breathing, headache or visual disturbances   Complete by: As directed    Call MD for:  extreme fatigue   Complete by: As directed    Call MD for:  hives   Complete by: As directed    Call MD for:  persistant dizziness or light-headedness   Complete by: As directed    Call MD for:  persistant nausea and vomiting   Complete by: As directed    Call MD for:  redness, tenderness, or signs of infection (pain, swelling, redness, odor or green/yellow discharge around incision site)   Complete by: As directed    Call MD for:  severe uncontrolled pain   Complete by: As directed    Call MD for:  temperature >100.4   Complete by: As directed    Diet - low sodium heart healthy   Complete by: As directed    Discharge instructions   Complete by: As directed    1. Follow up with your primary care provider in 1-2 weeks following discharge from  hospital.   Increase activity slowly   Complete by: As directed       Allergies as of 07/29/2023   No Known Allergies      Medication List     PAUSE taking these medications    lisinopril  30 MG tablet Wait to take this until your doctor or other care provider tells you to start again. Hold until seen by PCP next week for blood work Commonly known as: ZESTRIL  Take 30 mg by mouth daily.       TAKE these medications    aspirin  EC 81 MG tablet Take 81 mg by mouth daily. Swallow whole.   atorvastatin  40 MG tablet Commonly known as: LIPITOR Take 40 mg by mouth daily.   beta carotene w/minerals tablet Take 1 tablet by mouth daily with breakfast.   clopidogrel  75 MG tablet Commonly known as: PLAVIX  Take 1 tablet (75 mg total) by mouth daily.   colchicine 0.6 MG tablet Take 2 tablets (1.2 mg) at onset of gout flare. Take 1 additional tablet (0.6 mg) one hour later if symptoms persist. Maximum dose is 1.8 mg (3 tablets) in 24 hours. Then take one tablet (0.6 mg) daily until symptoms resolve.   cyclobenzaprine  5 MG tablet Commonly known as: FLEXERIL  Take 5 mg by mouth daily as needed.   dutasteride  0.5 MG capsule Commonly known as: AVODART  Take 0.5 mg by mouth daily.   finasteride 5 MG tablet Commonly known as: PROSCAR Take 1 tablet by mouth 3 (three) times a week.   gabapentin 100 MG capsule Commonly known as: NEURONTIN Take 300 mg by mouth at bedtime. What changed: Another medication with the same name was removed. Continue taking this medication, and follow the directions you see here.   isosorbide  mononitrate 60 MG 24 hr tablet Commonly known as: IMDUR  Take 1 tablet by mouth daily.   metoprolol  tartrate 25 MG tablet Commonly known as: LOPRESSOR  Take 1 tablet (25 mg total) by mouth 2 (two) times daily.   nitroGLYCERIN  0.4 MG SL tablet Commonly known as: NITROSTAT  Place 0.4 mg under the tongue as directed.   rOPINIRole  1 MG tablet Commonly known as:  REQUIP  Take 1 mg by mouth at bedtime.   traMADol 50 MG tablet Commonly known as: ULTRAM Take 50 mg by mouth 2 (two) times daily as needed.   TYLENOL  PO Take by mouth as needed.        No Known Allergies  Discharge Exam: Vitals:   07/29/23 1224 07/29/23 1504  BP: 106/76 131/73  Pulse: 79 84  Resp: 18 16  Temp: (!) 97.5 F (36.4 C) 97.6 F (36.4 C)  SpO2: 91% 93%    Physical Exam Vitals and nursing note reviewed.  Constitutional:      Appearance: He is obese.  HENT:     Head: Normocephalic and atraumatic.     Nose: Nose normal.  Eyes:     General: No scleral icterus.  Cardiovascular:     Rate and Rhythm: Normal rate and regular rhythm.     Pulses: Normal pulses.  Pulmonary:     Effort: Pulmonary effort is normal. No respiratory distress.     Breath sounds: Normal breath sounds. No wheezing.  Abdominal:     General: Abdomen is protuberant. Bowel sounds are normal. There is no distension.     Palpations: Abdomen is soft.     Tenderness: There is no abdominal tenderness. There is no guarding or rebound.  Musculoskeletal:     Right lower leg: No edema.     Left lower leg: No edema.  Skin:    General: Skin is warm and dry.     Capillary Refill: Capillary refill takes less than 2 seconds.  Neurological:     General: No focal deficit present.     Mental Status: He is alert and oriented to person, place, and time.     The results of significant diagnostics from this hospitalization (including imaging, microbiology, ancillary and laboratory) are listed below for reference.     Labs:  Basic Metabolic Panel: Recent Labs  Lab 07/27/23 1119 07/27/23 2035 07/27/23 2151 07/28/23 0157 07/28/23 0550 07/29/23 0409  NA 125* 135 135 134* 135 135  K 5.4*  --   --   --  4.7 4.5  CL 94*  --   --   --  104 105  CO2 22  --   --   --  23 21*  GLUCOSE 159*  --   --   --  122* 91  BUN 54*  --   --   --  42* 36*  CREATININE 2.06*  --   --   --  1.67* 1.53*  CALCIUM  8.9   --   --   --  8.7* 8.6*  MG  --   --   --   --   --  2.2   Liver Function Tests: Recent Labs  Lab 07/27/23 1119 07/28/23 0550  AST 22 17  ALT 17 15  ALKPHOS 56 48  BILITOT 1.1 1.2  PROT 6.9 6.2*  ALBUMIN 3.6 3.2*   CBC: Recent Labs  Lab 07/27/23 1119 07/28/23 0550 07/29/23 0409  WBC 14.3* 8.4 6.6  NEUTROABS 12.2*  --  5.0  HGB 15.0 14.0 13.0  HCT 44.2 42.7 38.9*  MCV 88.4 90.7 90.3  PLT 216 153 151   Lipid Profile Recent Labs    07/28/23 0550  CHOL 130  HDL 50  LDLCALC 62  TRIG 92  CHOLHDL 2.6   Urinalysis    Component Value Date/Time   COLORURINE YELLOW (A) 07/27/2023 1700   APPEARANCEUR HAZY (A) 07/27/2023 1700   APPEARANCEUR Clear 07/17/2014 1856   LABSPEC 1.017 07/27/2023 1700   LABSPEC 1.016 07/17/2014 1856   PHURINE 5.0 07/27/2023 1700   GLUCOSEU NEGATIVE 07/27/2023 1700   GLUCOSEU Negative 07/17/2014 1856   HGBUR NEGATIVE 07/27/2023 1700   BILIRUBINUR NEGATIVE 07/27/2023 1700   BILIRUBINUR Negative 07/17/2014 1856   KETONESUR NEGATIVE 07/27/2023 1700   PROTEINUR NEGATIVE 07/27/2023 1700   NITRITE NEGATIVE 07/27/2023 1700   LEUKOCYTESUR NEGATIVE 07/27/2023 1700   LEUKOCYTESUR Negative 07/17/2014 1856   Sepsis Labs Recent Labs  Lab 07/27/23 1119 07/28/23 0550 07/29/23 0409  WBC 14.3* 8.4 6.6    Procedures/Studies: DG Abd Portable 1V Result Date: 07/29/2023 CLINICAL DATA:  Follow up.  Nausea vomiting. EXAM: PORTABLE ABDOMEN - 1 VIEW COMPARISON:  07/28/2023. FINDINGS: Mild gaseous distention of small bowel  consistent with ileus. Retained oral contrast in the colon. Oral contrast overlying IMPRESSION: Small-bowel dilatation consistent with persistent ileus. Electronically Signed   By: Sydell Eva M.D.   On: 07/29/2023 10:55   DG Abd Portable 1V-Small Bowel Obstruction Protocol-initial, 8 hr delay Result Date: 07/28/2023 CLINICAL DATA:  Small bowel obstruction, 8 hour delayed film. EXAM: PORTABLE ABDOMEN - 1 VIEW COMPARISON:  CT yesterday  FINDINGS: Diffuse gaseous small bowel distension centrally. There is contrast in the ascending colon which may be from yesterday's CT. Contrast is otherwise seen within the stomach and small bowel. Multiple surgical clips in the right mid abdomen. IMPRESSION: 1. Contrast in the stomach and small bowel with diffuse small bowel distension. 2. There is contrast in the colon, this may be from yesterday's CT. Therefore recommend 24 hour delayed film. Electronically Signed   By: Chadwick Colonel M.D.   On: 07/28/2023 19:58   ECHOCARDIOGRAM COMPLETE Result Date: 07/28/2023    ECHOCARDIOGRAM REPORT   Patient Name:   Joel Mata Date of Exam: 07/27/2023 Medical Rec #:  [id number]     Height:       71.0 in Accession #:    [id number]    Weight:       240.3 lb Date of Birth:  December 24, 1934     BSA:          2.280 m Patient Age:    88 years      BP:           128/67 mmHg Patient Gender: M             HR:           102 bpm. Exam Location:  [facility] Procedure: 2D Echo, Cardiac Doppler, Color Doppler and Intracardiac            Opacification Agent (Both Spectral and Color Flow Doppler were            utilized during procedure). Indications:     NSTEMI I21.4  History:         Patient has no prior history of Echocardiogram examinations.                  Angina and Previous Myocardial Infarction.  Sonographer:     Terrilee Few RCS Referring Phys:  [id number] STEVEN J NEWTON Diagnosing Phys: Lida Reeks Alluri IMPRESSIONS  1. Left ventricular ejection fraction, by estimation, is 70 to 75%. The left ventricle has hyperdynamic function. The left ventricle has no regional wall motion abnormalities. There is mild left ventricular hypertrophy. Left ventricular diastolic parameters are indeterminate.  2. Right ventricular systolic function is normal. The right ventricular size is normal.  3. The mitral valve is normal in structure. No evidence of mitral valve regurgitation.  4. The aortic valve is tricuspid. There is mild thickening of the aortic  valve. Aortic valve regurgitation is not visualized. FINDINGS  Left Ventricle: Left ventricular ejection fraction, by estimation, is 70 to 75%. The left ventricle has hyperdynamic function. The left ventricle has no regional wall motion abnormalities. Definity contrast agent was given IV to delineate the left ventricular endocardial borders. The left ventricular internal cavity size was normal in size. There is mild left ventricular hypertrophy. Left ventricular diastolic parameters are indeterminate. Right Ventricle: The right ventricular size is normal. No increase in right ventricular wall thickness. Right ventricular systolic function is normal. Left Atrium: Left atrial size was normal in size. Right Atrium: Right atrial size was normal in size. Pericardium: There is  no evidence of pericardial effusion. Mitral Valve: The mitral valve is normal in structure. No evidence of mitral valve regurgitation. Tricuspid Valve: The tricuspid valve is normal in structure. Tricuspid valve regurgitation is trivial. Aortic Valve: The aortic valve is tricuspid. There is mild thickening of the aortic valve. Aortic valve regurgitation is not visualized. Aortic valve mean gradient measures 7.0 mmHg. Aortic valve peak gradient measures 14.3 mmHg. Aortic valve area, by VTI measures 2.31 cm. Pulmonic Valve: The pulmonic valve was not well visualized. Pulmonic valve regurgitation is not visualized. Aorta: The aortic root and ascending aorta are structurally normal, with no evidence of dilitation. IAS/Shunts: The atrial septum is grossly normal.  LEFT VENTRICLE PLAX 2D LVIDd:         4.05 cm   Diastology LVIDs:         2.65 cm   LV e' medial:    16.00 cm/s LV PW:         1.15 cm   LV E/e' medial:  6.0 LV IVS:        1.25 cm   LV e' lateral:   16.60 cm/s LVOT diam:     2.30 cm   LV E/e' lateral: 5.8 LV SV:         59 LV SV Index:   26 LVOT Area:     4.15 cm  RIGHT VENTRICLE RV S prime:     16.20 cm/s TAPSE (M-mode): 1.5 cm LEFT ATRIUM              Index        RIGHT ATRIUM           Index LA diam:        4.45 cm 1.95 cm/m   RA Area:     17.10 cm LA Vol (A2C):   67.2 ml 29.48 ml/m  RA Volume:   42.50 ml  18.64 ml/m LA Vol (A4C):   42.7 ml 18.73 ml/m LA Biplane Vol: 54.2 ml 23.78 ml/m  AORTIC VALVE AV Area (Vmax):    2.35 cm AV Area (Vmean):   2.27 cm AV Area (VTI):     2.31 cm AV Vmax:           189.00 cm/s AV Vmean:          124.000 cm/s AV VTI:            0.255 m AV Peak Grad:      14.3 mmHg AV Mean Grad:      7.0 mmHg LVOT Vmax:         107.00 cm/s LVOT Vmean:        67.600 cm/s LVOT VTI:          0.142 m LVOT/AV VTI ratio: 0.56  AORTA Ao Root diam: 3.50 cm Ao Asc diam:  3.90 cm MITRAL VALVE MV Area (PHT): 7.37 cm    SHUNTS MV Decel Time: 103 msec    Systemic VTI:  0.14 m MV E velocity: 96.40 cm/s  Systemic Diam: 2.30 cm MV A velocity: 50.30 cm/s MV E/A ratio:  1.92 Lida Reeks Alluri Electronically signed by Joetta Mustache Signature Date/Time: 07/28/2023/9:41:55 AM    Final    CT ABDOMEN PELVIS WO CONTRAST Result Date: 07/28/2023 CLINICAL DATA:  Abdominal pain, acute, nonlocalized EXAM: CT ABDOMEN AND PELVIS WITHOUT CONTRAST TECHNIQUE: Multidetector CT imaging of the abdomen and pelvis was performed following the standard protocol without IV contrast. RADIATION DOSE REDUCTION: This exam was performed according to the departmental dose-optimization program which  includes automated exposure control, adjustment of the mA and/or kV according to patient size and/or use of iterative reconstruction technique. COMPARISON:  Cm pelvis 12/04/2011 FINDINGS: Lower chest: Small hiatal hernia. Question distal esophageal wall thickening. Hepatobiliary: No focal liver abnormality. No gallstones, gallbladder wall thickening, or pericholecystic fluid. No biliary dilatation. Pancreas: Diffusely atrophic. No focal lesion. Otherwise normal pancreatic contour. No surrounding inflammatory changes. No main pancreatic ductal dilatation. Spleen: Normal in size  without focal abnormality. Adrenals/Urinary Tract: No adrenal nodule bilaterally. No nephrolithiasis and no hydronephrosis. Fluid density lesions likely represent simple renal cysts. Subcentimeter hypodense lesions too small to characterize-no further follow-up indicated. A 1.1 cm left renal lesion with a density of 91 Hounsfield units-due small to characterize follow-up. No ureterolithiasis or hydroureter. The urinary bladder is unremarkable. Stomach/Bowel: PO contrast reaches the mid small bowel. Stomach is within normal limits. No evidence of bowel wall thickening or dilatation. Several loops of small bowel along the anterior mid abdomen dilated with gas measuring up to 3.7 cm. No definite associated transition point. Distal small bowel appears decompressed. Colonic diverticulosis. Status post appendectomy. Vascular/Lymphatic: No abdominal aorta or iliac aneurysm. Severe atherosclerotic plaque of the aorta and its branches. No abdominal, pelvic, or inguinal lymphadenopathy. Reproductive: Prostate is enlarged measuring up to 6.1 cm. Other: No intraperitoneal free fluid. No intraperitoneal free gas. No organized fluid collection. Musculoskeletal: Partially visualized left anterior thigh soft tissue density measuring at least 5 x 2.5 cm. Partially visualized redemonstration of a fat density lesion along the left hip vastus lateralis muscle (2:101). No suspicious lytic or blastic osseous lesions. No acute displaced fracture. Multilevel severe degenerative changes of the spine. IMPRESSION: 1. Small hiatal hernia with question distal esophageal wall thickening. Correlate for esophagitis. Underlying mass not excluded. 2. Findings could represent developing early versus partial small bowel obstruction. Consider PO contrast administration and serial abdominal radiographs to evaluate passage of PO contrast to the colon. 3. Colonic diverticulosis with no acute diverticulitis. 4.  Aortic Atherosclerosis (ICD10-I70.0). 5.  Partially visualized left anterior thigh soft tissue density measuring at least 5 x 2.5 cm. Recommend further evaluation with grayscale and color Doppler ultrasound. 6. Partially visualized redemonstration of a left hip vastus lateralis intramuscular lipomatous lesion. Electronically Signed   By: Morgane  Naveau M.D.   On: 07/28/2023 02:40    Time coordinating discharge: 50 mins  SIGNED:  Unk Garb, DO [facility] 07/29/23, 6:23 PM

## 2023-07-29 NOTE — Progress Notes (Signed)
 PT Cancellation Note  Patient Details Name: Joel Mata MRN: [id number] DOB: 1934-10-03   Cancelled Treatment:    Reason Eval/Treat Not Completed: PT screened, no needs identified, will sign off. Patient ambulated 180 feet without AD prior to my arrival. Family and patient report no skilled needs at this time.     Alik Mawson 07/29/2023, 12:08 PM

## 2023-07-29 NOTE — Progress Notes (Signed)
 [facility] Cardiology  CARDIOLOGY PROGRESS NOTE  Patient ID: Joel Mata MRN: [id number] DOB/AGE: [birth year] 87 y.o.  Admit date: 07/27/2023 Referring Physician Dr. Farrel Hones Reason for Consultation NSTEMI  HPI: Patient with no complaints of chest pain.  No shortness of breath.  Review of systems complete and found to be negative unless listed above     Past Medical History:  Diagnosis Date   BPH (benign prostatic hyperplasia)    CAD (coronary artery disease)    Carotid stenosis    Hyperlipidemia    Hypertension    PAD (peripheral artery disease) (HCC)    RLS (restless legs syndrome)     Past Surgical History:  Procedure Laterality Date   APPENDECTOMY     CARDIAC CATHETERIZATION N/A 07/20/2014   Procedure: Left Heart Cath and Cors/Grafts Angiography;  Surgeon: Percival Brace, MD;  Location: [facility] INVASIVE CV LAB;  Service: Cardiovascular;  Laterality: N/A;   CATARACT EXTRACTION Bilateral    CORONARY ARTERY BYPASS GRAFT  2009   ENDOSCOPIC CARPAL TUNNEL RELEASE Left 2005   Left CEA  2006    Medications Prior to Admission  Medication Sig Dispense Refill Last Dose/Taking   Acetaminophen  (TYLENOL  PO) Take by mouth as needed.   Taking As Needed   aspirin  EC 81 MG tablet Take 81 mg by mouth daily. Swallow whole.   07/26/2023   atorvastatin  (LIPITOR) 40 MG tablet Take 40 mg by mouth daily.   07/26/2023   beta carotene w/minerals (OCUVITE) tablet Take 1 tablet by mouth daily with breakfast.   07/26/2023   colchicine 0.6 MG tablet Take 2 tablets (1.2 mg) at onset of gout flare. Take 1 additional tablet (0.6 mg) one hour later if symptoms persist. Maximum dose is 1.8 mg (3 tablets) in 24 hours. Then take one tablet (0.6 mg) daily until symptoms resolve.   07/26/2023   cyclobenzaprine  (FLEXERIL ) 5 MG tablet Take 5 mg by mouth daily as needed.   Taking As Needed   dutasteride  (AVODART ) 0.5 MG capsule Take 0.5 mg by mouth daily.   07/26/2023   finasteride (PROSCAR) 5 MG tablet Take 1 tablet by mouth 3  (three) times a week.   07/26/2023   gabapentin (NEURONTIN) 100 MG capsule Take 300 mg by mouth at bedtime.   07/26/2023   gabapentin (NEURONTIN) 100 MG capsule Take 3 capsules by mouth at bedtime.   07/26/2023   isosorbide  mononitrate (IMDUR ) 60 MG 24 hr tablet Take 1 tablet by mouth daily.   07/26/2023   lisinopril  (PRINIVIL ,ZESTRIL ) 30 MG tablet Take 30 mg by mouth daily.   07/26/2023   rOPINIRole  (REQUIP ) 1 MG tablet Take 1 mg by mouth at bedtime.   07/26/2023   traMADol (ULTRAM) 50 MG tablet Take 50 mg by mouth 2 (two) times daily as needed.   Taking As Needed   clopidogrel  (PLAVIX ) 75 MG tablet Take 1 tablet (75 mg total) by mouth daily. (Patient not taking: Reported on 07/27/2023) 30 tablet 5 Not Taking   metoprolol  tartrate (LOPRESSOR ) 25 MG tablet Take 1 tablet (25 mg total) by mouth 2 (two) times daily. (Patient not taking: Reported on 07/27/2023) 60 tablet 5 Not Taking   nitroGLYCERIN  (NITROSTAT ) 0.4 MG SL tablet Place 0.4 mg under the tongue as directed. (Patient not taking: Reported on 07/27/2023)   Not Taking   Social History   Socioeconomic History   Marital status: Married    Spouse name: Not on file   Number of children: Not on file   Years of education:  Not on file   Highest education level: Not on file  Occupational History   Not on file  Tobacco Use   Smoking status: Never   Smokeless tobacco: Never  Vaping Use   Vaping status: Never Used  Substance and Sexual Activity   Alcohol use: Not on file   Drug use: Not on file   Sexual activity: Not on file  Other Topics Concern   Not on file  Social History Narrative   Not on file   Social Drivers of Health   Financial Resource Strain: Low Risk  (07/13/2023)   Received from [facility]   Overall Financial Resource Strain (CARDIA)    Difficulty of Paying Living Expenses: Not hard at all  Food Insecurity: No Food Insecurity (07/27/2023)   Hunger Vital Sign    Worried About Running Out of Food in the Last Year: Never  true    Ran Out of Food in the Last Year: Never true  Transportation Needs: No Transportation Needs (07/27/2023)   PRAPARE - Administrator, Civil Service (Medical): No    Lack of Transportation (Non-Medical): No  Physical Activity: Not on file  Stress: Not on file  Social Connections: Moderately Integrated (07/27/2023)   Social Connection and Isolation Panel [NHANES]    Frequency of Communication with Friends and Family: More than three times a week    Frequency of Social Gatherings with Friends and Family: Twice a week    Attends Religious Services: 1 to 4 times per year    Active Member of [employer] or Organizations: No    Attends Banker Meetings: Never    Marital Status: Married  Catering manager Violence: Not At Risk (07/27/2023)   Humiliation, Afraid, Rape, and Kick questionnaire    Fear of Current or Ex-Partner: No    Emotionally Abused: No    Physically Abused: No    Sexually Abused: No    History reviewed. No pertinent family history.    Review of systems complete and found to be negative unless listed above      PHYSICAL EXAM  Alert and oriented x 3 No pedal edema No wheeze or crackles Significant murmur  Labs:   Lab Results  Component Value Date   WBC 6.6 07/29/2023   HGB 13.0 07/29/2023   HCT 38.9 (L) 07/29/2023   MCV 90.3 07/29/2023   PLT 151 07/29/2023    Recent Labs  Lab 07/28/23 0550 07/29/23 0409  NA 135 135  K 4.7 4.5  CL 104 105  CO2 23 21*  BUN 42* 36*  CREATININE 1.67* 1.53*  CALCIUM  8.7* 8.6*  PROT 6.2*  --   BILITOT 1.2  --   ALKPHOS 48  --   ALT 15  --   AST 17  --   GLUCOSE 122* 91   Lab Results  Component Value Date   CKTOTAL 402 (H) 07/19/2014   CKMB 41.9 (H) 07/19/2014   TROPONINI 8.74 (H) 07/20/2014    Lab Results  Component Value Date   CHOL 130 07/28/2023   CHOL 225 (H) 07/18/2014   Lab Results  Component Value Date   HDL 50 07/28/2023   HDL 41 07/18/2014   Lab Results  Component Value  Date   LDLCALC 62 07/28/2023   LDLCALC 151 (H) 07/18/2014   Lab Results  Component Value Date   TRIG 92 07/28/2023   TRIG 166 (H) 07/18/2014   Lab Results  Component Value Date   CHOLHDL  2.6 07/28/2023   No results found for: "LDLDIRECT"    Radiology: DG Abd Portable 1V Result Date: 07/29/2023 CLINICAL DATA:  Follow up.  Nausea vomiting. EXAM: PORTABLE ABDOMEN - 1 VIEW COMPARISON:  07/28/2023. FINDINGS: Mild gaseous distention of small bowel consistent with ileus. Retained oral contrast in the colon. Oral contrast overlying IMPRESSION: Small-bowel dilatation consistent with persistent ileus. Electronically Signed   By: Sydell Eva M.D.   On: 07/29/2023 10:55   DG Abd Portable 1V-Small Bowel Obstruction Protocol-initial, 8 hr delay Result Date: 07/28/2023 CLINICAL DATA:  Small bowel obstruction, 8 hour delayed film. EXAM: PORTABLE ABDOMEN - 1 VIEW COMPARISON:  CT yesterday FINDINGS: Diffuse gaseous small bowel distension centrally. There is contrast in the ascending colon which may be from yesterday's CT. Contrast is otherwise seen within the stomach and small bowel. Multiple surgical clips in the right mid abdomen. IMPRESSION: 1. Contrast in the stomach and small bowel with diffuse small bowel distension. 2. There is contrast in the colon, this may be from yesterday's CT. Therefore recommend 24 hour delayed film. Electronically Signed   By: Chadwick Colonel M.D.   On: 07/28/2023 19:58   ECHOCARDIOGRAM COMPLETE Result Date: 07/28/2023    ECHOCARDIOGRAM REPORT   Patient Name:   RAYDEL LATSKO Nabi Date of Exam: 07/27/2023 Medical Rec #:  [id number]     Height:       71.0 in Accession #:    [id number]    Weight:       240.3 lb Date of Birth:  [birth year]     BSA:          2.280 m Patient Age:    88 years      BP:           128/67 mmHg Patient Gender: M             HR:           102 bpm. Exam Location:  [facility] Procedure: 2D Echo, Cardiac Doppler, Color Doppler and Intracardiac            Opacification  Agent (Both Spectral and Color Flow Doppler were            utilized during procedure). Indications:     NSTEMI I21.4  History:         Patient has no prior history of Echocardiogram examinations.                  Angina and Previous Myocardial Infarction.  Sonographer:     Terrilee Few RCS Referring Phys:  [phone] STEVEN J NEWTON Diagnosing Phys: Lida Reeks Janyce Ellinger IMPRESSIONS  1. Left ventricular ejection fraction, by estimation, is 70 to 75%. The left ventricle has hyperdynamic function. The left ventricle has no regional wall motion abnormalities. There is mild left ventricular hypertrophy. Left ventricular diastolic parameters are indeterminate.  2. Right ventricular systolic function is normal. The right ventricular size is normal.  3. The mitral valve is normal in structure. No evidence of mitral valve regurgitation.  4. The aortic valve is tricuspid. There is mild thickening of the aortic valve. Aortic valve regurgitation is not visualized. FINDINGS  Left Ventricle: Left ventricular ejection fraction, by estimation, is 70 to 75%. The left ventricle has hyperdynamic function. The left ventricle has no regional wall motion abnormalities. Definity contrast agent was given IV to delineate the left ventricular endocardial borders. The left ventricular internal cavity size was normal in size. There is mild left ventricular hypertrophy. Left ventricular diastolic parameters are  indeterminate. Right Ventricle: The right ventricular size is normal. No increase in right ventricular wall thickness. Right ventricular systolic function is normal. Left Atrium: Left atrial size was normal in size. Right Atrium: Right atrial size was normal in size. Pericardium: There is no evidence of pericardial effusion. Mitral Valve: The mitral valve is normal in structure. No evidence of mitral valve regurgitation. Tricuspid Valve: The tricuspid valve is normal in structure. Tricuspid valve regurgitation is trivial. Aortic Valve: The  aortic valve is tricuspid. There is mild thickening of the aortic valve. Aortic valve regurgitation is not visualized. Aortic valve mean gradient measures 7.0 mmHg. Aortic valve peak gradient measures 14.3 mmHg. Aortic valve area, by VTI measures 2.31 cm. Pulmonic Valve: The pulmonic valve was not well visualized. Pulmonic valve regurgitation is not visualized. Aorta: The aortic root and ascending aorta are structurally normal, with no evidence of dilitation. IAS/Shunts: The atrial septum is grossly normal.  LEFT VENTRICLE PLAX 2D LVIDd:         4.05 cm   Diastology LVIDs:         2.65 cm   LV e' medial:    16.00 cm/s LV PW:         1.15 cm   LV E/e' medial:  6.0 LV IVS:        1.25 cm   LV e' lateral:   16.60 cm/s LVOT diam:     2.30 cm   LV E/e' lateral: 5.8 LV SV:         59 LV SV Index:   26 LVOT Area:     4.15 cm  RIGHT VENTRICLE RV S prime:     16.20 cm/s TAPSE (M-mode): 1.5 cm LEFT ATRIUM             Index        RIGHT ATRIUM           Index LA diam:        4.45 cm 1.95 cm/m   RA Area:     17.10 cm LA Vol (A2C):   67.2 ml 29.48 ml/m  RA Volume:   42.50 ml  18.64 ml/m LA Vol (A4C):   42.7 ml 18.73 ml/m LA Biplane Vol: 54.2 ml 23.78 ml/m  AORTIC VALVE AV Area (Vmax):    2.35 cm AV Area (Vmean):   2.27 cm AV Area (VTI):     2.31 cm AV Vmax:           189.00 cm/s AV Vmean:          124.000 cm/s AV VTI:            0.255 m AV Peak Grad:      14.3 mmHg AV Mean Grad:      7.0 mmHg LVOT Vmax:         107.00 cm/s LVOT Vmean:        67.600 cm/s LVOT VTI:          0.142 m LVOT/AV VTI ratio: 0.56  AORTA Ao Root diam: 3.50 cm Ao Asc diam:  3.90 cm MITRAL VALVE MV Area (PHT): 7.37 cm    SHUNTS MV Decel Time: 103 msec    Systemic VTI:  0.14 m MV E velocity: 96.40 cm/s  Systemic Diam: 2.30 cm MV A velocity: 50.30 cm/s MV E/A ratio:  1.92 Joetta Mustache Electronically signed by Joetta Mustache Signature Date/Time: 07/28/2023/9:41:55 AM    Final    CT ABDOMEN PELVIS WO CONTRAST Result Date: 07/28/2023 CLINICAL  DATA:  Abdominal pain, acute, nonlocalized EXAM: CT ABDOMEN AND PELVIS WITHOUT CONTRAST TECHNIQUE: Multidetector CT imaging of the abdomen and pelvis was performed following the standard protocol without IV contrast. RADIATION DOSE REDUCTION: This exam was performed according to the departmental dose-optimization program which includes automated exposure control, adjustment of the mA and/or kV according to patient size and/or use of iterative reconstruction technique. COMPARISON:  Cm pelvis 12/04/2011 FINDINGS: Lower chest: Small hiatal hernia. Question distal esophageal wall thickening. Hepatobiliary: No focal liver abnormality. No gallstones, gallbladder wall thickening, or pericholecystic fluid. No biliary dilatation. Pancreas: Diffusely atrophic. No focal lesion. Otherwise normal pancreatic contour. No surrounding inflammatory changes. No main pancreatic ductal dilatation. Spleen: Normal in size without focal abnormality. Adrenals/Urinary Tract: No adrenal nodule bilaterally. No nephrolithiasis and no hydronephrosis. Fluid density lesions likely represent simple renal cysts. Subcentimeter hypodense lesions too small to characterize-no further follow-up indicated. A 1.1 cm left renal lesion with a density of 91 Hounsfield units-due small to characterize follow-up. No ureterolithiasis or hydroureter. The urinary bladder is unremarkable. Stomach/Bowel: PO contrast reaches the mid small bowel. Stomach is within normal limits. No evidence of bowel wall thickening or dilatation. Several loops of small bowel along the anterior mid abdomen dilated with gas measuring up to 3.7 cm. No definite associated transition point. Distal small bowel appears decompressed. Colonic diverticulosis. Status post appendectomy. Vascular/Lymphatic: No abdominal aorta or iliac aneurysm. Severe atherosclerotic plaque of the aorta and its branches. No abdominal, pelvic, or inguinal lymphadenopathy. Reproductive: Prostate is enlarged measuring  up to 6.1 cm. Other: No intraperitoneal free fluid. No intraperitoneal free gas. No organized fluid collection. Musculoskeletal: Partially visualized left anterior thigh soft tissue density measuring at least 5 x 2.5 cm. Partially visualized redemonstration of a fat density lesion along the left hip vastus lateralis muscle (2:101). No suspicious lytic or blastic osseous lesions. No acute displaced fracture. Multilevel severe degenerative changes of the spine. IMPRESSION: 1. Small hiatal hernia with question distal esophageal wall thickening. Correlate for esophagitis. Underlying mass not excluded. 2. Findings could represent developing early versus partial small bowel obstruction. Consider PO contrast administration and serial abdominal radiographs to evaluate passage of PO contrast to the colon. 3. Colonic diverticulosis with no acute diverticulitis. 4.  Aortic Atherosclerosis (ICD10-I70.0). 5. Partially visualized left anterior thigh soft tissue density measuring at least 5 x 2.5 cm. Recommend further evaluation with grayscale and color Doppler ultrasound. 6. Partially visualized redemonstration of a left hip vastus lateralis intramuscular lipomatous lesion. Electronically Signed   By: Morgane  Naveau M.D.   On: 07/28/2023 02:40    ASSESSMENT AND PLAN:  NSTEMI, likely type II CAD with prior PCI and CABG Nausea, vomiting leading to dehydration/hypotension.  Blood pressure improved with IV hydration Acute kidney injury, improving  hypertension, hyperlipidemia  Patient with no cardiac complaints.  Echocardiogram with normal biventricular systolic function and no wall motion abnormalities.  Mild flat troponin elevation likely type II in setting of hypotension/acute kidney injury. Completed 48 hours of heparin .  Stop heparin  drip. Continue aspirin , statin, p.o. metoprolol . Will restart home medication of Imdur . Will hold off on lisinopril  with elevated creatinine and blood pressure is also not high at  this time. Will sign off, cardiology follow-up in 2 weeks  Signed: Janette Medley MD 07/29/2023, 11:34 AM

## 2023-07-29 NOTE — Consult Note (Signed)
 PHARMACY - ANTICOAGULATION CONSULT NOTE  Pharmacy Consult for Heparin  Indication: chest pain/ACS  No Known Allergies  Patient Measurements: Height: 5\' 11"  (180.3 cm) Weight: 109 kg (240 lb 4.8 oz) IBW/kg (Calculated) : 75.3 HEPARIN  DW (KG): 98.6  Vital Signs: Temp: 98.2 F (36.8 C) (05/11 0424) Temp Source: Oral (05/11 0424) BP: 136/73 (05/11 0424) Pulse Rate: 87 (05/11 0424)  Labs: Recent Labs    07/27/23 1119 07/27/23 1547 07/27/23 2035 07/27/23 2151 07/28/23 0018 07/28/23 0550 07/28/23 0810 07/29/23 0409  HGB 15.0  --   --   --   --  14.0  --  13.0  HCT 44.2  --   --   --   --  42.7  --  38.9*  PLT 216  --   --   --   --  153  --  151  APTT  --  33  --   --   --   --   --   --   LABPROT  --  13.7  --   --   --   --   --   --   INR  --  1.0  --   --   --   --   --   --   HEPARINUNFRC  --   --   --   --  0.50  --  0.52 0.55  CREATININE 2.06*  --   --   --   --  1.67*  --  1.53*  TROPONINIHS 125*  --  202* 182*  --   --   --   --     Estimated Creatinine Clearance: 43.5 mL/min (A) (by C-G formula based on SCr of 1.53 mg/dL (H)).   Medical History: Past Medical History:  Diagnosis Date   BPH (benign prostatic hyperplasia)    CAD (coronary artery disease)    Carotid stenosis    Hyperlipidemia    Hypertension    PAD (peripheral artery disease) (HCC)    RLS (restless legs syndrome)     Medications:  No history of chronic anticoagulation use PTA.  Assessment: 89 y.o. male with a past medical history of CAD, hypertension, hyperlipidemia, presents to the emergency department for generalized weakness dizziness and continued left lower back pain. Troponin level of 125. Pharmacy has been consulted to dose continuous heparin  infusion.  Baseline labs: Hgb 15.0, Plts 216, aPTT 33 sec, INR 1.0 Goal of Therapy:  Heparin  level 0.3-0.7 units/ml Monitor platelets by anticoagulation protocol: Yes   Plan:  5/11:  HL @ 0409 = 0.55, therapeutic X 3 - Will continue pt  on current rate and recheck HL with tomorrow with morning labs - CBC daily   Coretta Dexter, PharmD, [facility] 07/29/2023 6:07 AM

## 2023-07-29 NOTE — Progress Notes (Signed)
 PROGRESS NOTE    Joel Mata  UJW:[id number] DOB: 1934/06/15 DOA: 07/27/2023 PCP: Antonio Baumgarten, MD  Subjective: Pt seen and examined. Met with wife and dtr at bedside. Last bout of vomiting around 7 pm last night. Has passed gas.  Repeat abd xray shows contrast down to descending colon/sigmoid colon. Tolerating clear liquids.   Hospital Course: HPI: Joel Mata is a 89 y.o. male with medical history significant of coronary artery disease, hyperlipidemia, hypertension, peripheral vascular disease, restless leg presenting with hyponatremia, AKI, NSTEMI.  Patient noted to have been seen last week for hip bursitis.  Received a steroid shot.  Per report, had improvement in symptoms though worsening back pain over the past 4 to 5 days.  Was seen by orthopedic surgery with recommendations of symptoms likely coming from radicular lumbar pain.  Per the wife, patient had recurring episodes of nausea and vomiting as well as decreased p.o. intake over the past 3 to 4 days.  No fevers or chills.  No reported recent sick contacts.  No chest pain or shortness of breath.  Nausea has been nonbloody and nonbilious.  No bowel or bladder anesthesia.  No reported urinary incontinence. Presented to the ER afebrile, initial systolic pressures in the 70s-improved to the 120s with fluid bolus.  Satting well on room air.  White count 14.3, hemoglobin 15, platelets 216, creatinine 2.06, glucose 159, sodium 125. Troponin 125. EKG NSR.  Review of Systems: As mentioned in the history of present illness. All other systems reviewed and are negative.  Significant Events: Admitted 07/27/2023 for hyponatremia   Significant Labs: Na 125, K 5.4, CO2 of 22, BUN 54, Scr 2.06, glu 159 WBC 14.3, HgB 15, plt 216 Troponin I 125->202->182  Significant Imaging Studies: CT abd/pelvis Small hiatal hernia with question distal esophageal wall thickening. Correlate for esophagitis. Underlying mass not excluded. 2. Findings could  represent developing early versus partial small bowel obstruction. Consider PO contrast administration and serial abdominal radiographs to evaluate passage of PO contrast to the colon. 3. Colonic diverticulosis with no acute diverticulitis. 4.  Aortic Atherosclerosis (ICD10-I70.0). 5. Partially visualized left anterior thigh soft tissue density measuring at least 5 x 2.5 cm. Recommend further evaluation with grayscale and color Doppler ultrasound. 6. Partially visualized redemonstration of a left hip vastus lateralis intramuscular lipomatous lesion.  Antibiotic Therapy: Anti-infectives (From admission, onward)    None       Procedures:   Consultants: cardiology    Assessment and Plan: * Hyponatremia On admission. Sodium 125 on presentation Clinically dry in setting of recurrent nausea vomiting IV fluid hydration Trend sodium Urine and serum studies Goal 6-8 mill equivalent change over the next 12 to 24 hours  07-28-2023 pt with hypovolemic/hyponatremia. Na is normal now that his hypovolemia has been corrected.  07-29-2023 resolved. Cortisol level normal yesterday.  NSTEMI (non-ST elevated myocardial infarction) (HCC) On admission. Baseline coronary disease followed by Dr. Parks Bollman outpatient Troponin 120s on presentation with noted recurrent nausea EKG stable Started on a heparin  drip in the ER-continue Trend troponin 2D echo Follow cardiology recommendations  07-28-2023 likely demand ischemia due to hypotension/hypovolemia. Cards wants to continue IV heparin  for a total of 48 hours. Echo with hyperdynamic LVEF. 70%  07-29-2023 defer to cardiology. Hopefully IV heparin  can stop today.   Abnormal CT of the abdomen 07-28-2023 CT abd shows possible early/developing SBO. Pt without any further vomiting this AM. Pt is passing flatus. He wants something to eat/drink. Will allow for clear liquids. Give po gastrograffin  and check abd xr in 8 hours. Hopefully contrast reaches his  rectum and SBO can be ruled out.  07-29-2023 gastrograffin contrast within descending colon/sigmoid colon. No obstruction. Change to solid food.   Nausea and vomiting On admission. Noted recurrent nausea and vomiting x 24  Non bloody-non bilious emesis  CT A&P pending to better assess  Noted recent steroid use  IV PPI  Noted secondary NSTEMI is a confounding issue  Monitor   07-28-2023 CT abd shows possible early/developing SBO. Pt without any further vomiting this AM. Pt is passing flatus. He wants something to eat/drink. Will allow for clear liquids. Give po gastrograffin and check abd xr in 8 hours. Hopefully contrast reaches his rectum and SBO can be ruled out   07-29-2023 gastrograffin contrast within descending colon/sigmoid colon. No obstruction. Change to solid food.  Lumbar back pain On admission. Noted lumbar back pain followed by outpatient orthopedic surgery No bowel or bladder incontinence Will check lumbar plain films x 1 Pain control Monitor  07-28-2023 wife states pt able to roll back and forth in bed last night without any difficulty. He has not been able to do that is several weeks due to back pain. PT consult.  07-29-2023 pt no longer having acute back pain. Has chronic back pain.  AKI (acute kidney injury) (HCC) On admission. Creatinine 2 on presentation Clinically dry IV fluid hydration Hold offending agents Monitor  07-28-2023 Scr 1.67. on admission, Scr 2.06. at baseline scr 1.2.  continue with IVF. Monitor Scr  07-29-2023 Scr 1.53 today. Baseline 1.2. will stop IVF today and allow pt to hydrate by himself.  Obesity, Class I, BMI 30-34.9 Body mass index is 33.52 kg/m.   Hyperlipidemia 07-28-2023 Continue statin  07-29-2023 on statin.  Hypertension On admission.  BP stable. Titrate home regimen  07-28-2023 given his volume depletion, AKI. Hold ACEI. Ok for lopressor  due to NSTEMI.  07-29-2023 on lopressor  12.5 mg bid. Continue to hold ACEI  due to AKI.   DVT prophylaxis:   IV Heparin    Code Status: Full Code Family Communication: discussed with pt and wife at bedside Disposition Plan: return home Reason for continuing need for hospitalization: stop IVF. Change to solid food. Monitor PO intake. Remains on IV heparin .  Objective: Vitals:   07/28/23 2031 07/29/23 0016 07/29/23 0424 07/29/23 0746  BP: 120/89 (!) 142/74 136/73 (!) 141/62  Pulse: 100 93 87 83  Resp: 16 18 18 18   Temp: 98.7 F (37.1 C) 98.2 F (36.8 C) 98.2 F (36.8 C) 98 F (36.7 C)  TempSrc: Oral Oral Oral   SpO2: (!) 89% 92% 92% 93%  Weight:      Height:        Intake/Output Summary (Last 24 hours) at 07/29/2023 1113 Last data filed at 07/29/2023 0900 Gross per 24 hour  Intake --  Output 200 ml  Net -200 ml   Filed Weights   07/27/23 1449  Weight: 109 kg    Examination:  Physical Exam Vitals and nursing note reviewed.  Constitutional:      Appearance: He is obese.  HENT:     Head: Normocephalic and atraumatic.     Nose: Nose normal.  Eyes:     General: No scleral icterus. Cardiovascular:     Rate and Rhythm: Normal rate and regular rhythm.     Pulses: Normal pulses.  Pulmonary:     Effort: Pulmonary effort is normal. No respiratory distress.     Breath sounds: Normal breath sounds. No  wheezing.  Abdominal:     General: Abdomen is protuberant. Bowel sounds are normal. There is no distension.     Palpations: Abdomen is soft.     Tenderness: There is no abdominal tenderness. There is no guarding or rebound.  Musculoskeletal:     Right lower leg: No edema.     Left lower leg: No edema.  Skin:    General: Skin is warm and dry.     Capillary Refill: Capillary refill takes less than 2 seconds.  Neurological:     General: No focal deficit present.     Mental Status: He is alert and oriented to person, place, and time.     Data Reviewed: I have personally reviewed following labs and imaging studies  CBC: Recent Labs  Lab  07/27/23 1119 07/28/23 0550 07/29/23 0409  WBC 14.3* 8.4 6.6  NEUTROABS 12.2*  --  5.0  HGB 15.0 14.0 13.0  HCT 44.2 42.7 38.9*  MCV 88.4 90.7 90.3  PLT 216 153 151   Basic Metabolic Panel: Recent Labs  Lab 07/27/23 1119 07/27/23 2035 07/27/23 2151 07/28/23 0157 07/28/23 0550 07/29/23 0409  NA 125* 135 135 134* 135 135  K 5.4*  --   --   --  4.7 4.5  CL 94*  --   --   --  104 105  CO2 22  --   --   --  23 21*  GLUCOSE 159*  --   --   --  122* 91  BUN 54*  --   --   --  42* 36*  CREATININE 2.06*  --   --   --  1.67* 1.53*  CALCIUM  8.9  --   --   --  8.7* 8.6*  MG  --   --   --   --   --  2.2   GFR: Estimated Creatinine Clearance: 43.5 mL/min (A) (by C-G formula based on SCr of 1.53 mg/dL (H)). Liver Function Tests: Recent Labs  Lab 07/27/23 1119 07/28/23 0550  AST 22 17  ALT 17 15  ALKPHOS 56 48  BILITOT 1.1 1.2  PROT 6.9 6.2*  ALBUMIN 3.6 3.2*   Coagulation Profile: Recent Labs  Lab 07/27/23 1547  INR 1.0   Lipid Profile: Recent Labs    07/28/23 0550  CHOL 130  HDL 50  LDLCALC 62  TRIG 92  CHOLHDL 2.6   Sepsis Labs: Recent Labs  Lab 07/27/23 1119 07/27/23 1441  LATICACIDVEN 2.1* 1.3    Radiology Studies: DG Abd Portable 1V Result Date: 07/29/2023 CLINICAL DATA:  Follow up.  Nausea vomiting. EXAM: PORTABLE ABDOMEN - 1 VIEW COMPARISON:  07/28/2023. FINDINGS: Mild gaseous distention of small bowel consistent with ileus. Retained oral contrast in the colon. Oral contrast overlying IMPRESSION: Small-bowel dilatation consistent with persistent ileus. Electronically Signed   By: Sydell Eva M.D.   On: 07/29/2023 10:55   DG Abd Portable 1V-Small Bowel Obstruction Protocol-initial, 8 hr delay Result Date: 07/28/2023 CLINICAL DATA:  Small bowel obstruction, 8 hour delayed film. EXAM: PORTABLE ABDOMEN - 1 VIEW COMPARISON:  CT yesterday FINDINGS: Diffuse gaseous small bowel distension centrally. There is contrast in the ascending colon which may be  from yesterday's CT. Contrast is otherwise seen within the stomach and small bowel. Multiple surgical clips in the right mid abdomen. IMPRESSION: 1. Contrast in the stomach and small bowel with diffuse small bowel distension. 2. There is contrast in the colon, this may be from yesterday's CT. Therefore recommend  24 hour delayed film. Electronically Signed   By: Chadwick Colonel M.D.   On: 07/28/2023 19:58   ECHOCARDIOGRAM COMPLETE Result Date: 07/28/2023    ECHOCARDIOGRAM REPORT   Patient Name:   Joel Mata Herdt Date of Exam: 07/27/2023 Medical Rec #:  [id number]     Height:       71.0 in Accession #:    [id number]    Weight:       240.3 lb Date of Birth:  1934/12/19     BSA:          2.280 m Patient Age:    88 years      BP:           128/67 mmHg Patient Gender: M             HR:           102 bpm. Exam Location:  [facility] Procedure: 2D Echo, Cardiac Doppler, Color Doppler and Intracardiac            Opacification Agent (Both Spectral and Color Flow Doppler were            utilized during procedure). Indications:     NSTEMI I21.4  History:         Patient has no prior history of Echocardiogram examinations.                  Angina and Previous Myocardial Infarction.  Sonographer:     Terrilee Few RCS Referring Phys:  [phone] STEVEN J NEWTON Diagnosing Phys: Lida Reeks Alluri IMPRESSIONS  1. Left ventricular ejection fraction, by estimation, is 70 to 75%. The left ventricle has hyperdynamic function. The left ventricle has no regional wall motion abnormalities. There is mild left ventricular hypertrophy. Left ventricular diastolic parameters are indeterminate.  2. Right ventricular systolic function is normal. The right ventricular size is normal.  3. The mitral valve is normal in structure. No evidence of mitral valve regurgitation.  4. The aortic valve is tricuspid. There is mild thickening of the aortic valve. Aortic valve regurgitation is not visualized. FINDINGS  Left Ventricle: Left ventricular ejection fraction, by  estimation, is 70 to 75%. The left ventricle has hyperdynamic function. The left ventricle has no regional wall motion abnormalities. Definity contrast agent was given IV to delineate the left ventricular endocardial borders. The left ventricular internal cavity size was normal in size. There is mild left ventricular hypertrophy. Left ventricular diastolic parameters are indeterminate. Right Ventricle: The right ventricular size is normal. No increase in right ventricular wall thickness. Right ventricular systolic function is normal. Left Atrium: Left atrial size was normal in size. Right Atrium: Right atrial size was normal in size. Pericardium: There is no evidence of pericardial effusion. Mitral Valve: The mitral valve is normal in structure. No evidence of mitral valve regurgitation. Tricuspid Valve: The tricuspid valve is normal in structure. Tricuspid valve regurgitation is trivial. Aortic Valve: The aortic valve is tricuspid. There is mild thickening of the aortic valve. Aortic valve regurgitation is not visualized. Aortic valve mean gradient measures 7.0 mmHg. Aortic valve peak gradient measures 14.3 mmHg. Aortic valve area, by VTI measures 2.31 cm. Pulmonic Valve: The pulmonic valve was not well visualized. Pulmonic valve regurgitation is not visualized. Aorta: The aortic root and ascending aorta are structurally normal, with no evidence of dilitation. IAS/Shunts: The atrial septum is grossly normal.  LEFT VENTRICLE PLAX 2D LVIDd:         4.05 cm   Diastology LVIDs:  2.65 cm   LV e' medial:    16.00 cm/s LV PW:         1.15 cm   LV E/e' medial:  6.0 LV IVS:        1.25 cm   LV e' lateral:   16.60 cm/s LVOT diam:     2.30 cm   LV E/e' lateral: 5.8 LV SV:         59 LV SV Index:   26 LVOT Area:     4.15 cm  RIGHT VENTRICLE RV S prime:     16.20 cm/s TAPSE (M-mode): 1.5 cm LEFT ATRIUM             Index        RIGHT ATRIUM           Index LA diam:        4.45 cm 1.95 cm/m   RA Area:     17.10 cm LA  Vol (A2C):   67.2 ml 29.48 ml/m  RA Volume:   42.50 ml  18.64 ml/m LA Vol (A4C):   42.7 ml 18.73 ml/m LA Biplane Vol: 54.2 ml 23.78 ml/m  AORTIC VALVE AV Area (Vmax):    2.35 cm AV Area (Vmean):   2.27 cm AV Area (VTI):     2.31 cm AV Vmax:           189.00 cm/s AV Vmean:          124.000 cm/s AV VTI:            0.255 m AV Peak Grad:      14.3 mmHg AV Mean Grad:      7.0 mmHg LVOT Vmax:         107.00 cm/s LVOT Vmean:        67.600 cm/s LVOT VTI:          0.142 m LVOT/AV VTI ratio: 0.56  AORTA Ao Root diam: 3.50 cm Ao Asc diam:  3.90 cm MITRAL VALVE MV Area (PHT): 7.37 cm    SHUNTS MV Decel Time: 103 msec    Systemic VTI:  0.14 m MV E velocity: 96.40 cm/s  Systemic Diam: 2.30 cm MV A velocity: 50.30 cm/s MV E/A ratio:  1.92 Lida Reeks Alluri Electronically signed by Joetta Mustache Signature Date/Time: 07/28/2023/9:41:55 AM    Final    CT ABDOMEN PELVIS WO CONTRAST Result Date: 07/28/2023 CLINICAL DATA:  Abdominal pain, acute, nonlocalized EXAM: CT ABDOMEN AND PELVIS WITHOUT CONTRAST TECHNIQUE: Multidetector CT imaging of the abdomen and pelvis was performed following the standard protocol without IV contrast. RADIATION DOSE REDUCTION: This exam was performed according to the departmental dose-optimization program which includes automated exposure control, adjustment of the mA and/or kV according to patient size and/or use of iterative reconstruction technique. COMPARISON:  Cm pelvis 12/04/2011 FINDINGS: Lower chest: Small hiatal hernia. Question distal esophageal wall thickening. Hepatobiliary: No focal liver abnormality. No gallstones, gallbladder wall thickening, or pericholecystic fluid. No biliary dilatation. Pancreas: Diffusely atrophic. No focal lesion. Otherwise normal pancreatic contour. No surrounding inflammatory changes. No main pancreatic ductal dilatation. Spleen: Normal in size without focal abnormality. Adrenals/Urinary Tract: No adrenal nodule bilaterally. No nephrolithiasis and no  hydronephrosis. Fluid density lesions likely represent simple renal cysts. Subcentimeter hypodense lesions too small to characterize-no further follow-up indicated. A 1.1 cm left renal lesion with a density of 91 Hounsfield units-due small to characterize follow-up. No ureterolithiasis or hydroureter. The urinary bladder is unremarkable. Stomach/Bowel: PO contrast reaches the mid small bowel.  Stomach is within normal limits. No evidence of bowel wall thickening or dilatation. Several loops of small bowel along the anterior mid abdomen dilated with gas measuring up to 3.7 cm. No definite associated transition point. Distal small bowel appears decompressed. Colonic diverticulosis. Status post appendectomy. Vascular/Lymphatic: No abdominal aorta or iliac aneurysm. Severe atherosclerotic plaque of the aorta and its branches. No abdominal, pelvic, or inguinal lymphadenopathy. Reproductive: Prostate is enlarged measuring up to 6.1 cm. Other: No intraperitoneal free fluid. No intraperitoneal free gas. No organized fluid collection. Musculoskeletal: Partially visualized left anterior thigh soft tissue density measuring at least 5 x 2.5 cm. Partially visualized redemonstration of a fat density lesion along the left hip vastus lateralis muscle (2:101). No suspicious lytic or blastic osseous lesions. No acute displaced fracture. Multilevel severe degenerative changes of the spine. IMPRESSION: 1. Small hiatal hernia with question distal esophageal wall thickening. Correlate for esophagitis. Underlying mass not excluded. 2. Findings could represent developing early versus partial small bowel obstruction. Consider PO contrast administration and serial abdominal radiographs to evaluate passage of PO contrast to the colon. 3. Colonic diverticulosis with no acute diverticulitis. 4.  Aortic Atherosclerosis (ICD10-I70.0). 5. Partially visualized left anterior thigh soft tissue density measuring at least 5 x 2.5 cm. Recommend further  evaluation with grayscale and color Doppler ultrasound. 6. Partially visualized redemonstration of a left hip vastus lateralis intramuscular lipomatous lesion. Electronically Signed   By: Morgane  Naveau M.D.   On: 07/28/2023 02:40    Scheduled Meds:  aspirin  EC  81 mg Oral Daily   atorvastatin   40 mg Oral Daily   dutasteride   0.5 mg Oral Daily   gabapentin  300 mg Oral QHS   metoprolol  tartrate  12.5 mg Oral BID   pantoprazole   40 mg Oral Daily   rOPINIRole   1 mg Oral QHS   simethicone  160 mg Oral QID   Continuous Infusions:  heparin  1,400 Units/hr (07/29/23 0305)   promethazine (PHENERGAN) injection (IM or IVPB) 12.5 mg (07/28/23 1321)     LOS: 1 day   Time spent: 50 minutes  Unk Garb, DO  [facility]  07/29/2023, 11:13 AM

## 2023-08-07 ENCOUNTER — Other Ambulatory Visit: Payer: Self-pay | Admitting: Physical Medicine and Rehabilitation

## 2023-08-07 DIAGNOSIS — M48062 Spinal stenosis, lumbar region with neurogenic claudication: Secondary | ICD-10-CM

## 2023-08-07 DIAGNOSIS — M5416 Radiculopathy, lumbar region: Secondary | ICD-10-CM

## 2023-08-14 ENCOUNTER — Ambulatory Visit
Admission: RE | Admit: 2023-08-14 | Discharge: 2023-08-14 | Disposition: A | Discharge status: Home / Self Care | Source: Ambulatory Visit | Attending: Physical Medicine and Rehabilitation | Admitting: Physical Medicine and Rehabilitation

## 2023-08-14 DIAGNOSIS — M48062 Spinal stenosis, lumbar region with neurogenic claudication: Secondary | ICD-10-CM

## 2023-08-14 DIAGNOSIS — M5416 Radiculopathy, lumbar region: Secondary | ICD-10-CM
